# Patient Record
Sex: Female | Born: 1935
Health system: Southern US, Community
[De-identification: ages and names within clinical notes are randomized; demographics above are authoritative.]

## PROBLEM LIST (undated history)

## (undated) DIAGNOSIS — H9190 Unspecified hearing loss, unspecified ear: Secondary | ICD-10-CM

## (undated) DIAGNOSIS — I10 Essential (primary) hypertension: Secondary | ICD-10-CM

## (undated) DIAGNOSIS — M1990 Unspecified osteoarthritis, unspecified site: Secondary | ICD-10-CM

## (undated) HISTORY — PX: WRIST FRACTURE SURGERY: SHX121

## (undated) HISTORY — PX: COLONOSCOPY: SHX174

## (undated) HISTORY — DX: Essential (primary) hypertension: I10

## (undated) HISTORY — PX: JOINT REPLACEMENT: SHX530

## (undated) HISTORY — PX: HIP SURGERY: SHX245

## (undated) HISTORY — PX: TONSILLECTOMY: SUR1361

## (undated) HISTORY — PX: UPPER GASTROINTESTINAL ENDOSCOPY: SHX188

## (undated) HISTORY — PX: APPENDECTOMY: SHX54

---

## 1998-01-30 ENCOUNTER — Ambulatory Visit (HOSPITAL_COMMUNITY): Admission: RE | Admit: 1998-01-30 | Discharge: 1998-01-30 | Payer: Self-pay | Admitting: *Deleted

## 1998-03-06 ENCOUNTER — Ambulatory Visit (HOSPITAL_COMMUNITY): Admission: RE | Admit: 1998-03-06 | Discharge: 1998-03-06 | Payer: Self-pay | Admitting: Internal Medicine

## 1999-02-19 ENCOUNTER — Ambulatory Visit (HOSPITAL_COMMUNITY): Admission: RE | Admit: 1999-02-19 | Discharge: 1999-02-19 | Payer: Self-pay | Admitting: Obstetrics & Gynecology

## 1999-03-22 ENCOUNTER — Encounter: Payer: Self-pay | Admitting: Orthopedic Surgery

## 1999-03-30 ENCOUNTER — Inpatient Hospital Stay (HOSPITAL_COMMUNITY): Admission: RE | Admit: 1999-03-30 | Discharge: 1999-04-01 | Payer: Self-pay | Admitting: Orthopedic Surgery

## 1999-03-30 ENCOUNTER — Encounter: Payer: Self-pay | Admitting: Orthopedic Surgery

## 1999-04-01 ENCOUNTER — Ambulatory Visit (HOSPITAL_COMMUNITY)
Admission: RE | Admit: 1999-04-01 | Discharge: 1999-04-01 | Payer: Self-pay | Admitting: Physical Medicine & Rehabilitation

## 1999-04-01 ENCOUNTER — Inpatient Hospital Stay
Admission: RE | Admit: 1999-04-01 | Discharge: 1999-04-10 | Payer: Self-pay | Admitting: Physical Medicine & Rehabilitation

## 1999-07-26 ENCOUNTER — Ambulatory Visit (HOSPITAL_COMMUNITY): Admission: RE | Admit: 1999-07-26 | Discharge: 1999-07-26 | Payer: Self-pay | Admitting: Obstetrics & Gynecology

## 2000-02-04 ENCOUNTER — Ambulatory Visit (HOSPITAL_COMMUNITY): Admission: RE | Admit: 2000-02-04 | Discharge: 2000-02-04 | Payer: Self-pay | Admitting: *Deleted

## 2000-09-19 ENCOUNTER — Ambulatory Visit (HOSPITAL_COMMUNITY): Admission: RE | Admit: 2000-09-19 | Discharge: 2000-09-19 | Payer: Self-pay

## 2001-02-16 ENCOUNTER — Ambulatory Visit (HOSPITAL_COMMUNITY): Admission: RE | Admit: 2001-02-16 | Discharge: 2001-02-16 | Payer: Self-pay | Admitting: Obstetrics & Gynecology

## 2001-05-21 ENCOUNTER — Encounter: Payer: Self-pay | Admitting: Internal Medicine

## 2001-05-21 ENCOUNTER — Ambulatory Visit (HOSPITAL_COMMUNITY): Admission: RE | Admit: 2001-05-21 | Discharge: 2001-05-21 | Payer: Self-pay | Admitting: Internal Medicine

## 2001-07-10 ENCOUNTER — Encounter (HOSPITAL_COMMUNITY): Admission: RE | Admit: 2001-07-10 | Discharge: 2001-08-09 | Payer: Self-pay | Admitting: Specialist

## 2002-02-22 ENCOUNTER — Ambulatory Visit (HOSPITAL_COMMUNITY): Admission: RE | Admit: 2002-02-22 | Discharge: 2002-02-22 | Payer: Self-pay | Admitting: *Deleted

## 2002-05-06 ENCOUNTER — Ambulatory Visit (HOSPITAL_COMMUNITY): Admission: RE | Admit: 2002-05-06 | Discharge: 2002-05-06 | Payer: Self-pay

## 2003-03-19 ENCOUNTER — Ambulatory Visit (HOSPITAL_COMMUNITY): Admission: RE | Admit: 2003-03-19 | Discharge: 2003-03-19 | Payer: Self-pay | Admitting: Internal Medicine

## 2004-11-01 ENCOUNTER — Ambulatory Visit (HOSPITAL_COMMUNITY): Admission: RE | Admit: 2004-11-01 | Discharge: 2004-11-01 | Payer: Self-pay | Admitting: Internal Medicine

## 2005-11-07 ENCOUNTER — Ambulatory Visit (HOSPITAL_COMMUNITY): Admission: RE | Admit: 2005-11-07 | Discharge: 2005-11-07 | Payer: Self-pay | Admitting: Internal Medicine

## 2006-11-09 ENCOUNTER — Ambulatory Visit (HOSPITAL_COMMUNITY): Admission: RE | Admit: 2006-11-09 | Discharge: 2006-11-09 | Payer: Self-pay | Admitting: Internal Medicine

## 2007-05-25 ENCOUNTER — Ambulatory Visit (HOSPITAL_COMMUNITY): Admission: RE | Admit: 2007-05-25 | Discharge: 2007-05-25 | Payer: Self-pay | Admitting: Internal Medicine

## 2007-06-14 ENCOUNTER — Encounter (HOSPITAL_COMMUNITY): Admission: RE | Admit: 2007-06-14 | Discharge: 2007-07-14 | Payer: Self-pay | Admitting: Orthopedic Surgery

## 2007-11-22 ENCOUNTER — Ambulatory Visit (HOSPITAL_COMMUNITY): Admission: RE | Admit: 2007-11-22 | Discharge: 2007-11-22 | Payer: Self-pay | Admitting: Internal Medicine

## 2008-12-10 ENCOUNTER — Ambulatory Visit (HOSPITAL_COMMUNITY): Admission: RE | Admit: 2008-12-10 | Discharge: 2008-12-10 | Payer: Self-pay | Admitting: Internal Medicine

## 2010-01-25 ENCOUNTER — Ambulatory Visit (HOSPITAL_COMMUNITY)
Admission: RE | Admit: 2010-01-25 | Discharge: 2010-01-25 | Payer: Self-pay | Source: Home / Self Care | Attending: Internal Medicine | Admitting: Internal Medicine

## 2010-06-04 NOTE — Consult Note (Signed)
NAME:  Kristen, Marshall NO.:  [id number]   MEDICAL RECORD NO.:  [id number]                  PATIENT TYPE:   LOCATION:                                       FACILITY:   PHYSICIAN:  Kristen Marshall, M.D.                 DATE OF BIRTH:   DATE OF CONSULTATION:  02/27/2003  DATE OF DISCHARGE:                                   CONSULTATION   REQUESTING PHYSICIAN:  Kristen Marshall, M.D.   CHIEF COMPLAINT:  Hematochezia.   HISTORY OF PRESENT ILLNESS:  Kristen Marshall is a 77 year old Caucasian  female who presents with a five-day history of bright red rectal bleeding  with bowel movements.  She describes this as a moderate amount on toilet and  in the stool.  She does have history of adenomatous cecal polyp which was  found on April 20, 1994 colonoscopy.  This was followed by repeat colonoscopy  in [place] 2001 which was normal.  She does have positive family history  with father diagnosed with metastatic colon carcinoma at age 61.  She denies  any associated abdominal pain; however, she is complaining of some right hip  soreness.  She denies any history of hemorrhoids, rectal pain, or pruritus.  She has been having daily bowel movements without any constipation or  diarrhea.  She denies any nausea or vomiting.  She also has history of  occasional dysphagia which has been becoming progressively worse.  She has a  history of a large sliding hiatal hernia and distal esophageal ring that was  last dilated by Dr. Karilyn Marshall in 2001.   PAST SURGICAL HISTORY:  1. Appendectomy.  2. Tonsillectomy.  3. Shoulder surgery.  4. Left hip replacement.  5. Left carpal tunnel release.   CURRENT MEDICATIONS:  1. Hydrochlorothiazide 25 mg daily.  2. Captopril 25 mg daily.  3. Calcium daily.   ALLERGIES:  CODEINE.   FAMILY HISTORY:  Positive for father with colorectal carcinoma as described  in the HPI.  Mother is deceased at age 20 secondary to hypertension.  She  has one  brother in good health.   SOCIAL HISTORY:  Kristen Marshall is a widow and lives alone.  She has four  grown, healthy children.  She is a retired Museum/gallery exhibitions officer.  She  denies any tobacco, alcohol, or drug use.   REVIEW OF SYSTEMS:  CONSTITUTIONAL:  Weight is steadily increasing.  Denies  any fatigue, denies any fever or chills.  CARDIOVASCULAR:  Denies any chest  pain or palpitations.  PULMONARY:  Denies any shortness of breath, dyspnea,  or hemoptysis.  GI:  See HPI.   PHYSICAL EXAMINATION:  VITAL SIGNS:  Weight 146.5 pounds, height 63.25  inches.  Temperature 97.3, blood pressure 130/80, pulse 70.  GENERAL:  Kristen Marshall is a 77 year old Caucasian female who is well-  developed, well-nourished, in no apparent distress today.  She is  alert,  oriented, pleasant, and cooperative.  HEENT:  Sclerae clear, anicteric.  Conjunctivae pink.  Oropharynx pink and  moist without any lesions.  NECK:  Supple without mass or thyromegaly.  CHEST:  Heart regular rate and rhythm with normal S1, S2, without any  murmurs, clicks, rubs, or gallops.  LUNGS: Clear to auscultation bilaterally.  ABDOMEN:  Positive bowel sounds x4, soft, nontender, nondistended, with no  palpable mass or hepatosplenomegaly.  EXTREMITIES:  Show 2+ pedal pulses bilaterally with no edema.  SKIN:  Pink, warm, and dry without any rashes or jaundice.  RECTAL:  No visible external hemorrhoids, good sphincter tone.  The vault  was clear; however, small amount of clear secretions were hemoccult  negative; however, specimen was poor.   ASSESSMENT:  1. Kristen Marshall is a 77 year old Caucasian female with 5 days of intermittent     hematochezia and history of adenomatous colon polyps with positive family     history for colorectal carcinoma.  I have discussed this with Dr. Karilyn Marshall     and would recommended further evaluation with colonoscopy.  2. Dysphagia, probably secondary to esophageal ring.  Will do an EGD at the     same time  with possible esophageal dilatation of ring.   RECOMMENDATIONS:  1. I have discussed both EGD and colonoscopy with Kristen Marshall.  Discussion     includes risks and benefits which include but are not limited to     bleeding, perforation, infection, infection, and drug reaction.  She     agrees with this plan.  Consent will be obtained.  2. Further recommendations pending procedures.   We would like to thank Dr. Ouida Marshall for allowing [place] to participate in the care  of Kristen Marshall.     ________________________________________  ___________________________________________  Kristen Marshall, N.P.                  Kristen Marshall, M.D.   KC/MEDQ  D:  02/27/2003  T:  02/27/2003  Job:  [id number]   cc:   Kristen Marshall, M.D.  [address]  [place]  [place] [address]  Fax: [phone]

## 2010-06-04 NOTE — Discharge Summary (Signed)
[place]  Patient:    KIARA, MCDOWELL                        MRN: [id number] Adm. Date:  [id number] Disc. Date: [id number] Attending:  Faith Rogue T Dictator:   Dian Situ, PA CC:         Georges Lynch. Darrelyn Hillock, M.D.                           Discharge Summary  DISCHARGE DIAGNOSES: 1. Left total hip replacement. 2. Hypertension. 3. Postop anemia. 4. Mild renal insufficiency. 5. Citrobacter UTI.  HISTORY OF PRESENT ILLNESS:  Ms. Lunabelle Oatley is a 77 year old female with history of hypertension and end-stage DJD left hip who elected to undergo left total hip replacement on 3/13 by Dr. Darrelyn Hillock.  Postop discharged on weightbearing status with knee immobilizer used to help keep her left foot everted.  Coumadin is being used for DVT prophylaxis.  INR 2.0.  The patient has had some issues with postop constipation.  Foley was discontinued the same.  CURRENT ISSUES:  Assist for bed mobility and transfers, min-assist ambulating 8 feet with standard walker.  PAST MEDICAL HISTORY:  OA, hypertension, appendectomy, tonsillectomy, rotator cuff repair, and benign breast biopsy.  ALLERGIES:  CODEINE and MORPHINE.  SOCIAL HISTORY:  Patient lives alone and was independent prior to admission. She lives in one-level home with one step at entry.  She does not use any tobacco or alcohol.  HOSPITAL COURSE:  Ms. Najiyah Paris was admitted to rehab on 04/01/99 for inpatient therapies to consist of PT/OT daily.  Past admission patient was maintained on Coumadin for DVT prophylaxis.  As patient made complaints of urinary retention, PVRs were checked and patient was noted to empty without too much difficulty.  Urine C&S checked at admission showed no growth; however, patient was noted to have increase in symptomatology with low-grade fever and a repeat urine C&S on 3/18 grew out greater than 100,000 colonies of Citrobacter.  She was started on Macrodantin  for this and is to continue on this past discharge.  Labs were checked on admission and repeated as patient was noted to have an episode of heme-positive stools.  Her H&H was noted to be improving.  Last check of CBC on 3/21 shows hemoglobin 10.1, hematocrit 29.1, white count 3.7, platelets 430.  Electrolytes from 3/23 showed sodium 135, potassium 3.5, chloride 100, CO2 of 28, BUN 25, creatinine 0.8, glucose 97.  Patient was instructed to push p.o. fluids as she reports she is limiting p.o. fluid intake to help avoid frequency.  Patients staples remained intact and she is to follow up with Dr. Darrelyn Hillock next week with staple removal.  PT/INR at time of discharge is at 20.4 and 2.4 and patient is discharged on 3 mg a day of Coumadin.  Patient to continue on Coumadin for four additional weeks to complete her DVT prophylaxis.  Next protime is to be drawn on Tuesday with results to Dr. ______.  Dr. ______ has graciously agreed to follow patients INR and adjust Coumadin as needed.  Patients left hip incision has healed well without any signs or symptoms of infection.  No drainage or erythema noted.  Follow-up therapies in terms of home PT and OT have been set up through [facility].  On 04/10/99, patient is discharged to home.  DISCHARGE MEDICATIONS:  1. Coumadin 3 mg p.o. per  day.  2. ______ one p.o. b.i.d.  3. Macrodantin 100 mg b.i.d.  4. Coumadin 3 mg q.p.m.  5. Vioxx 12.5 mg per day.  6. Oretic 50 mg per day.  7. Pepcid 20 mg b.i.d.  8. Capoten 25 mg q.12h.  9. Miacalcin nasal spray one puff q.d. 10. Os-Cal 500+D two p.o. b.i.d. 11. Tylenol 650 mg p.o. q.4-6h. p.r.n. pain.  ACTIVITY:  Maintain touchdown weightbearing status on left lower extremity and to use walker.  DIET:  Regular.  SPECIAL INSTRUCTIONS:  No alcohol, no smoking, no driving.  No aspirin, aspirin products, or NSAIDs while on Coumadin.  Maintain total hip precautions.  FOLLOWUP:  Patient is to follow  up with Dr. Darrelyn Hillock next week for postop check to include staple removal.  Follow up with Dr. ______ per schedule.  Follow up with Dr. Riley Kill as needed. DD:  04/10/99 TD:  04/11/99 Job: [id number]

## 2010-06-04 NOTE — Op Note (Signed)
NAME:  Kristen Marshall, Kristen Marshall                           ACCOUNT NO.:  [id number]   MEDICAL RECORD NO.:  [id number]                   PATIENT TYPE:  AMB   LOCATION:  DAY                                  FACILITY:  [facility]   PHYSICIAN:  Lionel December, M.D.                 DATE OF BIRTH:  [birth year]   DATE OF PROCEDURE:  03/19/2003  DATE OF DISCHARGE:                                 OPERATIVE REPORT   PROCEDURE:  Esophagogastroduodenoscopy with esophageal dilatation followed  by total colonoscopy.   INDICATIONS FOR PROCEDURE:  Kristen Marshall is a 77 year old Caucasian female  with known hiatal hernia who was having intermittent heartburn and  regurgitation and now presents with solid food dysphagia.  She is presently  on no acid suppression. She is also undergoing diagnostic and surveillance  colonoscopy.  She had a cecal adenoma removed in 1996 but last exam about  four years ago was normal. She is now having intermittent hematochezia.  Family history is positive for colon carcinoma in her father. The procedure  is reviewed with the patient and informed consent was obtained.   PREOP MEDICATIONS:  Cetacaine spray for pharyngeal topical anesthesia,  Demerol 25 mg IV, Versed 4 mg IV.   FINDINGS:  Procedure performed in endoscopy suite. The patient's vital signs  and O2 sat were monitored during the procedure and remained stable.   #1.  ESOPHAGOGASTRODUODENOSCOPY.  The patient was placed in the left lateral  decubitus position and Olympus videoscope was passed through oropharynx  without any difficulty into the esophagus.   ESOPHAGUS:  The mucosa of the esophagus normal.  She had a ring at the GE  junction 30 cm from the incisors.  The diaphragmatic hiatus was at 37 cm.  She had a large hernia with a dependent segment portion on the left side  marked by mucosal ridge.  Pictures taken for the record.   STOMACH:  It was empty and distended very well with insufflation. Folds of  the proximal stomach  were normal. Examination of the mucosa, body, antrum,  pyloric channel as well as angularis, fundus and cardia was normal.  This  hernia was easily seen on retroflexed view.   DUODENUM:  Examination of the bulb, second and third part of the duodenum  was normal. The endoscope was withdrawn. Esophageal dilatation was attempted  by passing a 56 French Maloney dilator but this could not be advanced to  full insertion.  I felt I was entering the dependent segment of the hernia  on the left side. Therefore it was withdrawn.  The endoscope was passed  again and this ring was dilated/disrupted with the balloon initially to 18  and then to 20 mm. The endoscope was withdrawn.  Pictures were taken for the  record. The patient was prepared for procedure #2.   #2.  COLONOSCOPY:  Rectal examination performed. No abnormality noted on  external or  digital exam. The Olympus videoscope was placed in the rectum  and advanced under direct vision into the sigmoid colon and beyond.  Redundant colon with excellent prep.  The scope was passed through the cecum  which was identified by the appendiceal stump  and the ileocecal valve.  Pictures taken for the record.  There was a small polyp on a fold to the  left of the appendiceal stump. This was ablated via cold biopsy.  As the  scope was withdrawn, the colonic mucosa was once again carefully examined  and there were no other abnormalities.  The rectal mucosa was normal.  The  scope was retroflexed to examine anorectal junction and small hemorrhoids  were noted below the dentate line. The endoscope was straightened and  withdrawn. The patient tolerated the procedure well.   FINAL DIAGNOSIS:  1. Distal esophageal ring was dilated and disrupted using 18-20 mm balloon.  2. Large sliding hiatal hernia.  3. Small polyp ablated via cold biopsy from the cecum.  4. Small external hemorrhoids felt to be the source of her hematochezia.   RECOMMENDATIONS:  1. She will  continue antireflux measures as before.  2. Omeprazole or Prilosec OTC 20 mg p.o. q.a.m.  3. She will continue high fiber diet and Metamucil as before.  4. Colace 1-2 tablets at bedtime.   I will be contacting the patient with biopsy results and further  recommendations.      ___________________________________________                                            Lionel December, M.D.   NR/MEDQ  D:  03/19/2003  T:  03/19/2003  Job:  [id number]   cc:   Kingsley Callander. Ouida Sills, M.D.  [address]  [place]  [place] [address]  Fax: [phone]

## 2010-08-26 ENCOUNTER — Other Ambulatory Visit: Payer: Self-pay

## 2010-08-26 ENCOUNTER — Ambulatory Visit (INDEPENDENT_AMBULATORY_CARE_PROVIDER_SITE_OTHER): Payer: Medicare Other | Admitting: Orthopedic Surgery

## 2010-08-26 ENCOUNTER — Encounter: Payer: Self-pay | Admitting: Orthopedic Surgery

## 2010-08-26 VITALS — Resp 16 | Ht 63.5 in | Wt 148.0 lb

## 2010-08-26 DIAGNOSIS — M23329 Other meniscus derangements, posterior horn of medial meniscus, unspecified knee: Secondary | ICD-10-CM

## 2010-08-26 NOTE — Patient Instructions (Signed)
Apply ice as needed until swelling goes down  Wear brace   Continue ibuprofen or aleve   Return in 6 weeks for re-evaluation

## 2010-08-26 NOTE — Progress Notes (Signed)
Chief complaint: RIGHT knee pain HPI:(4) The patient was walking up some steps the knee gave out it popped locked and she's had sharp medial knee pain since that time although after some Aleve and Advil her pain is decreased significantly.  She still has swelling as 6/10 constant pain but she does not have any mechanical symptoms.  The knee feels a little tight and swollen.  Date of injury was 2 weeks ago  ROS:(2) No chest pain no shortness of breath no frequency no numbness.  He  PFSH: (1) She's had 2 hip replacements, on the RIGHT side she had a mini incision hip [place] in [place] and on the LEFT side she had a regular hip replacement in [facility]  Physical Exam(12) GENERAL: normal development   CDV: pulses are normal   Skin: normal  Lymph: nodes were not palpable/normal  Psychiatric: awake, alert and oriented  Neuro: normal sensation  MSK She is ambulating without any assistive device or a limp 1RIGHT knee.  Inspection she has medial joint line tenderness she has knee swelling.  Her range of motion is normal at 220.  Strength is normal knee is stable.  McMurray's sign is negative.  Assessment: X-rays were taken there is some mild arthritis some osteopenia but no acute fracture there is an abnormality at the front of the knee which appears to be a spur not seen on the lateral view.  Impression probable medial meniscal tear vs. Osteoarthritis/contusion    Plan: Recommend knee brace, ice, anti-inflammatories reevaluate in 6 weeks. A separate identifiable x-ray report 3 views RIGHT knee, x-ray for knee pain rule out fracture  Mild osteopenia is no acute fracture mild osteoarthritis  Impression mild osteoarthritis.

## 2010-10-07 ENCOUNTER — Encounter: Payer: Self-pay | Admitting: Orthopedic Surgery

## 2010-10-07 ENCOUNTER — Ambulatory Visit (INDEPENDENT_AMBULATORY_CARE_PROVIDER_SITE_OTHER): Payer: Medicare Other | Admitting: Orthopedic Surgery

## 2010-10-07 VITALS — Ht 63.0 in | Wt 148.0 lb

## 2010-10-07 DIAGNOSIS — M25569 Pain in unspecified knee: Secondary | ICD-10-CM

## 2010-10-07 NOTE — Patient Instructions (Signed)
Resume normal activity

## 2010-10-07 NOTE — Progress Notes (Signed)
Impression probable medial meniscal tear vs. Osteoarthritis/contusion  F/U   The patient's knee is no longer symptomatic.  Full  range of motion. No tenderness. No swelling.  Follow up as needed

## 2011-01-15 ENCOUNTER — Other Ambulatory Visit (HOSPITAL_COMMUNITY): Payer: Self-pay | Admitting: Internal Medicine

## 2011-01-15 DIAGNOSIS — Z0000 Encounter for general adult medical examination without abnormal findings: Secondary | ICD-10-CM

## 2011-01-24 DIAGNOSIS — H9319 Tinnitus, unspecified ear: Secondary | ICD-10-CM | POA: Diagnosis not present

## 2011-01-31 ENCOUNTER — Ambulatory Visit (HOSPITAL_COMMUNITY)
Admission: RE | Admit: 2011-01-31 | Discharge: 2011-01-31 | Disposition: A | Discharge status: Home / Self Care | Payer: Medicare Other | Source: Ambulatory Visit | Attending: Internal Medicine | Admitting: Internal Medicine

## 2011-01-31 DIAGNOSIS — Z0000 Encounter for general adult medical examination without abnormal findings: Secondary | ICD-10-CM

## 2011-01-31 DIAGNOSIS — Z1231 Encounter for screening mammogram for malignant neoplasm of breast: Secondary | ICD-10-CM | POA: Diagnosis not present

## 2011-02-22 ENCOUNTER — Encounter (INDEPENDENT_AMBULATORY_CARE_PROVIDER_SITE_OTHER): Payer: Self-pay | Admitting: *Deleted

## 2011-02-22 ENCOUNTER — Encounter (INDEPENDENT_AMBULATORY_CARE_PROVIDER_SITE_OTHER): Payer: Self-pay | Admitting: Internal Medicine

## 2011-02-22 ENCOUNTER — Ambulatory Visit (INDEPENDENT_AMBULATORY_CARE_PROVIDER_SITE_OTHER): Payer: Medicare Other | Admitting: Internal Medicine

## 2011-02-22 ENCOUNTER — Telehealth (INDEPENDENT_AMBULATORY_CARE_PROVIDER_SITE_OTHER): Payer: Self-pay | Admitting: *Deleted

## 2011-02-22 ENCOUNTER — Other Ambulatory Visit (INDEPENDENT_AMBULATORY_CARE_PROVIDER_SITE_OTHER): Payer: Self-pay | Admitting: *Deleted

## 2011-02-22 VITALS — BP 120/82 | HR 76 | Temp 98.3°F | Resp 14 | Ht 63.0 in | Wt 148.0 lb

## 2011-02-22 DIAGNOSIS — M1711 Unilateral primary osteoarthritis, right knee: Secondary | ICD-10-CM | POA: Insufficient documentation

## 2011-02-22 DIAGNOSIS — K921 Melena: Secondary | ICD-10-CM

## 2011-02-22 DIAGNOSIS — I10 Essential (primary) hypertension: Secondary | ICD-10-CM | POA: Insufficient documentation

## 2011-02-22 DIAGNOSIS — Z8489 Family history of other specified conditions: Secondary | ICD-10-CM

## 2011-02-22 DIAGNOSIS — Z800 Family history of malignant neoplasm of digestive organs: Secondary | ICD-10-CM

## 2011-02-22 NOTE — H&P (Signed)
Dictated; Job [id number]

## 2011-02-22 NOTE — H&P (Signed)
NAMELEELA, Marshall NO.:  [id number]  MEDICAL RECORD NO.:  [id number]  LOCATION:                                 FACILITY:  PHYSICIAN:  Lionel December, M.D.    DATE OF BIRTH:  1925-03-15  DATE OF ADMISSION: DATE OF DISCHARGE:  LH                             HISTORY & PHYSICAL   PRIMARY CARE PHYSICIAN:  Kingsley Callander. Ouida Sills, MD  PRESENTING COMPLAINT:  Intermittent hematochezia.  HISTORY OF PRESENT ILLNESS:  Kristen Marshall is an 78 year old Caucasian female who presents with few month history of intermittent hematochezia.  She believes this is coming from hemorrhoids.  Because of family history of colon carcinoma in her father, she just wants to be checked to make sure nothing else is going on.  She generally notices bright red blood unusually small in amount.  No pain is associated and she sees blood with her stools.  She has occasional constipation.  She denies abdominal pain, anorexia, weight loss.  She also denies dysphagia.  She has had her esophagus dilated in the past.  She has good appetite and her weight has been stable.  CURRENT MEDICATIONS: 1. Carboxymethylcellulose sodium eyedrops 1-2 drops in each eye twice     daily. 2. Vitamin D 400 units p.o. daily. 3. HCTZ 25 mg p.o. daily. 4. Ibuprofen 600 mg daily p.r.n. which is not often. 5. Lisinopril 20 mg p.o. daily. 6. MVI one p.o. daily.  PAST MEDICAL HISTORY:  She has hypertension.  She has osteoarthrosis of right knee.  She has been under care of Dr. Roseanne Reno and treated with brace and pool therapy last year, and presently doing well.  She had left hip replacement 11 years ago and right hip was done 4 years ago.  She had surgery pending for left wrist fracture and had decompression of carpal tunnel as well.  She also had surgery on left shoulder for a fracture.  She has had bilateral cataract extraction.  History of hiatal hernia.  She has had her esophagus dilated in 2005, and more recently in August  2008.  The patient's last colonoscopy was in August 2008, revealing few diverticula at sigmoid colon and external hemorrhoids.  ALLERGIES:  To codeine.  FAMILY HISTORY:  Father was diagnosed with colon carcinoma at age 2 and died within 2 months of diagnosis.  By the time, condition was diagnosed, already had widely spread.  Mother died of pneumonia at age 54.  She has a brother, age 65 who has brain tumor and currently getting hospice care at home.  She has a grandson, who was treated for leukemia and developed cirrhosis due to chronic hepatitis C from transfusion and now coping at her liver transplant.  SOCIAL HISTORY:  She is retired.  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  She has 2 daughters and 2 sons and they are all in good health.  She does not smoke cigarettes or drink alcohol.  OBJECTIVE:  VITAL SIGNS:  Weight 148 pounds, she is 63 inches tall, pulse 76 per minute, blood pressure 120/82, respirations 16, and temp is  98.3. HEENT:  Conjunctivae is pink.  Sclera is nonicteric.  Oropharyngeal mucosa is normal.  Dentition in satisfactory condition.  No neck masses or thyromegaly noted. CARDIAC:  With regular rhythm.  Normal S1, S2.  No murmur or gallop noted. LUNGS:  Clear to auscultation. ABDOMEN:  Symmetrical.  She has subcutaneous soft lesion in midepigastric region with erythematous skin, but no punctum noted.  (she has had a sebaceous cyst removed from this area 40 years ago).  Abdomen is otherwise soft and nontender without organomegaly or masses. RECTAL:  Deferred. EXTREMITIES:  No peripheral edema or clubbing noted.  ASSESSMENT:  Kristen Marshall is an 79 year old Caucasian female who is in excellent health who presents with intermittent hematochezia occurring 2-3 times a month.  She does not have any significant problem with constipation, diarrhea, or abdominal pain.  Family  history is significant for colon carcinoma in her father who was diagnosed with at age 71 and died within 2 months of metastatic disease.  Given the patient is in excellent health, it would be reasonable to recommend colonoscopy since she is having intermittent hematochezia, which is most likely secondary to hemorrhoids, but need to make sure she does not have other lesions.  She has history of hiatal hernia.  She has had a esophageal ring dilated twice in the past, but presently she is not having these symptoms, therefore no indication for EGD.  She has what appears to be sebaceous cyst in epigastric region.  She needs to have it removed before it gets infected.  She will make an appointment to see her dermatologist.  RECOMMENDATIONS:  We will schedule her for colonoscopy at [facility] in near future.  I have answered the patient's questions and she is agreeable.                                           ______________________________ Lionel December, M.D.     NR/MEDQ  D:  02/22/2011  T:  02/22/2011  Job:  [id number]  cc:   Kingsley Callander. Ouida Sills, MD Fax: [phone]

## 2011-02-22 NOTE — Patient Instructions (Signed)
Colonoscopy to be scheduled. 

## 2011-02-22 NOTE — Telephone Encounter (Signed)
Patient needs trilyte 

## 2011-02-25 MED ORDER — PEG 3350-KCL-NA BICARB-NACL 420 G PO SOLR: 4000.0000 mL | Freq: Once | ORAL | Status: AC

## 2011-02-28 NOTE — Progress Notes (Signed)
PRIMARY CARE PHYSICIAN: Kingsley Callander. Ouida Sills, MD  PRESENTING COMPLAINT: Intermittent hematochezia.  HISTORY OF PRESENT ILLNESS: Kristen Marshall is an 78 year old Caucasian female who  presents with few month history of intermittent hematochezia. She  believes this is coming from hemorrhoids. Because of family history of  colon carcinoma in her father, she just wants to be checked to make sure  nothing else is going on. She generally notices bright red blood  unusually small in amount. No pain is associated and she sees blood  with her stools. She has occasional constipation. She denies abdominal  pain, anorexia, weight loss. She also denies dysphagia. She has had  her esophagus dilated in the past. She has good appetite and her weight  has been stable.  CURRENT MEDICATIONS:  1. Carboxymethylcellulose sodium eyedrops 1-2 drops in each eye twice  daily.  2. Vitamin D 400 units p.o. daily.  3. HCTZ 25 mg p.o. daily.  4. Ibuprofen 600 mg daily p.r.n. which is not often.  5. Lisinopril 20 mg p.o. daily.  6. MVI one p.o. daily.  PAST MEDICAL HISTORY: She has hypertension.  She has osteoarthrosis of right knee. She has been under care of Dr.  Romeo Apple and treated with brace and pool therapy last year, and presently  doing well.  She had left hip replacement 11 years ago and right hip was done 4 years  ago.  She had surgery pending for left wrist fracture and had decompression of  carpal tunnel as well.  She also had surgery on left shoulder for a fracture.  She has had bilateral cataract extraction.  History of hiatal hernia. She has had her esophagus dilated in 2005,  and more recently in August 2008.  The patient's last colonoscopy was in August 2008, revealing few  diverticula at sigmoid colon and external hemorrhoids.  ALLERGIES: To codeine.  FAMILY HISTORY: Father was diagnosed with colon carcinoma at age 30 and  died within 2 months of diagnosis. By the time, condition was  diagnosed, already had  widely spread. Mother died of pneumonia at age  87. She has a brother, age 36 who has brain tumor and currently getting  hospice care at home. She has a grandson, who was treated for leukemia  and developed cirrhosis due to chronic hepatitis C from transfusion and  now coping at her liver transplant.  SOCIAL HISTORY: She is retired. She worked in Administrator transcription  for 12 years at [employer] and for 13 years at [employer] before retirement. She has 2 daughters and 2 sons and they are  all in good health. She does not smoke cigarettes or drink alcohol.  OBJECTIVE: VITAL SIGNS: Weight 148 pounds, she is 63 inches tall,  pulse 76 per minute, blood pressure 120/82, respirations 16, and temp is  98.3.  HEENT: Conjunctivae is pink. Sclera is nonicteric. Oropharyngeal  mucosa is normal. Dentition in satisfactory condition. No neck masses  or thyromegaly noted.  CARDIAC: With regular rhythm. Normal S1, S2. No murmur or gallop  noted.  LUNGS: Clear to auscultation.  ABDOMEN: Symmetrical. She has subcutaneous soft lesion in  midepigastric region with erythematous skin, but no punctum noted. (she  has had a sebaceous cyst removed from this area 40 years ago). Abdomen  is otherwise soft and nontender without organomegaly or masses.  RECTAL: Deferred.  EXTREMITIES: No peripheral edema or clubbing noted.  ASSESSMENT: Kristen Marshall is an 78 year old Caucasian female who is in excellent  health who presents with intermittent hematochezia occurring  2-3 times a  month. She does not have any significant problem with constipation,  diarrhea, or abdominal pain. Family history is significant for colon  carcinoma in her father who was diagnosed with at age 64 and died within  2 months of metastatic disease. Given the patient is in excellent  health, it would be reasonable to recommend colonoscopy since she is  having intermittent hematochezia, which is most likely secondary to    hemorrhoids, but need to make sure she does not have other lesions.  She has history of hiatal hernia. She has had a esophageal ring dilated  twice in the past, but presently she is not having these symptoms,  therefore no indication for EGD.  She has what appears to be sebaceous cyst in epigastric region. She  needs to have it removed before it gets infected. She will make an  appointment to see her dermatologist.  RECOMMENDATIONS: We will schedule her for colonoscopy at [facility] in near  future. I have answered the patient's questions and she is agreeable

## 2011-03-16 MED ORDER — SODIUM CHLORIDE 0.45 % IV SOLN
Freq: Once | INTRAVENOUS | Status: AC
  Administered 2011-03-17: 13:00:00 via INTRAVENOUS

## 2011-03-17 ENCOUNTER — Encounter (HOSPITAL_COMMUNITY): Payer: Self-pay

## 2011-03-17 ENCOUNTER — Encounter (HOSPITAL_COMMUNITY)
Admission: RE | Disposition: A | Discharge status: Home / Self Care | Payer: Self-pay | Source: Ambulatory Visit | Attending: Internal Medicine

## 2011-03-17 ENCOUNTER — Ambulatory Visit (HOSPITAL_COMMUNITY)
Admission: RE | Admit: 2011-03-17 | Discharge: 2011-03-17 | Disposition: A | Discharge status: Home / Self Care | Payer: Medicare Other | Source: Ambulatory Visit | Attending: Internal Medicine | Admitting: Internal Medicine

## 2011-03-17 DIAGNOSIS — Z800 Family history of malignant neoplasm of digestive organs: Secondary | ICD-10-CM

## 2011-03-17 DIAGNOSIS — K6389 Other specified diseases of intestine: Secondary | ICD-10-CM

## 2011-03-17 DIAGNOSIS — K648 Other hemorrhoids: Secondary | ICD-10-CM | POA: Insufficient documentation

## 2011-03-17 DIAGNOSIS — K5730 Diverticulosis of large intestine without perforation or abscess without bleeding: Secondary | ICD-10-CM

## 2011-03-17 DIAGNOSIS — K5900 Constipation, unspecified: Secondary | ICD-10-CM

## 2011-03-17 DIAGNOSIS — K921 Melena: Secondary | ICD-10-CM

## 2011-03-17 HISTORY — DX: Unspecified osteoarthritis, unspecified site: M19.90

## 2011-03-17 HISTORY — DX: Unspecified hearing loss, unspecified ear: H91.90

## 2011-03-17 HISTORY — PX: COLONOSCOPY: SHX5424

## 2011-03-17 SURGERY — COLONOSCOPY
Anesthesia: Moderate Sedation

## 2011-03-17 MED ORDER — STERILE WATER FOR IRRIGATION IR SOLN
Status: DC | PRN
  Administered 2011-03-17: 14:00:00

## 2011-03-17 MED ORDER — MIDAZOLAM HCL 5 MG/5ML IJ SOLN
INTRAMUSCULAR | Status: DC | PRN
  Administered 2011-03-17: 2 mg via INTRAVENOUS
  Administered 2011-03-17: 1 mg via INTRAVENOUS

## 2011-03-17 MED ORDER — MIDAZOLAM HCL 5 MG/5ML IJ SOLN
INTRAMUSCULAR | Status: AC
  Filled 2011-03-17: qty 10

## 2011-03-17 MED ORDER — MEPERIDINE HCL 50 MG/ML IJ SOLN
INTRAMUSCULAR | Status: DC | PRN
  Administered 2011-03-17: 15 mg via INTRAVENOUS
  Administered 2011-03-17: 10 mg via INTRAVENOUS

## 2011-03-17 MED ORDER — MEPERIDINE HCL 50 MG/ML IJ SOLN
INTRAMUSCULAR | Status: AC
  Filled 2011-03-17: qty 1

## 2011-03-17 NOTE — Op Note (Addendum)
COLONOSCOPY PROCEDURE REPORT  PATIENT:  Kristen Marshall  MR#:  [id number] Birthdate:  1925-05-26, 78 y.o., female Endoscopist:  Dr. Malissa Hippo, MD Referred By:  Dr. Rockey Situ, MD Procedure Date: 03/17/2011  Procedure:   Colonoscopy  Indications:  Patient is is an 78 year old Caucasian female with intermittent hematochezia. Family history is positive for colon carcinoma in her father who died at age 46 within 2 months of diagnosis. She is undergoing diagnostic colonoscopy to  Informed Consent:  Procedure and risks were reviewed with the patient and informed consent was obtained.  Medications:  Demerol 25 mg IV Versed 3 mg IV  Description of procedure:  After a digital rectal exam was performed, that colonoscope was advanced from the anus through the rectum and colon to the area of the cecum, ileocecal valve and appendiceal orifice. The cecum was deeply intubated. These structures were well-seen and photographed for the record. From the level of the cecum and ileocecal valve, the scope was slowly and cautiously withdrawn. The mucosal surfaces were carefully surveyed utilizing scope tip to flexion to facilitate fold flattening as needed. The scope was pulled down into the rectum where a thorough exam including retroflexion was performed.  Findings:   Prep satisfactory. Moderate number of diverticula in sigmoid colon and a few more proximally. About 15 mm submucosal lipoma at transverse colon. Normal rectal mucosa. Small hemorrhoids of the dentate line.  Therapeutic/Diagnostic Maneuvers Performed:  None  Complications:  None  Cecal Withdrawal Time:  10 minutes  Impression:  Examination performed to cecum. Pancolonic diverticulosis. Most of the diverticula of sigmoid colon. Incidental finding of submucosal lipoma at transverse colon Internal hemorrhoids felt to be source of intermittent hematochezia.  Recommendations:  Standard instructions given.  Meryn Sarracino U  03/17/2011  2:17 PM  CC: Dr. Carylon Perches, MD, MD & Dr. Bonnetta Barry ref. provider found

## 2011-03-17 NOTE — Discharge Instructions (Addendum)
Resume medications. High fiber diet. Can take Colace(stool softener) two  tablets daily at bedtime if needed for constipation. No driving for 24 hours.High Fiber Diet A high fiber diet changes your normal diet to include more whole grains, legumes, fruits, and vegetables. Changes in the diet involve replacing refined carbohydrates with unrefined foods. The calorie level of the diet is essentially unchanged. The Dietary Reference Intake (recommended amount) for adult males is 38 g per day. For adult females, it is 25 g per day. Pregnant and lactating women should consume 28 g of fiber per day. Fiber is the intact part of a plant that is not broken down during digestion. Functional fiber is fiber that has been isolated from the plant to provide a beneficial effect in the body. PURPOSE  Increase stool bulk.   Ease and regulate bowel movements.   Lower cholesterol.  INDICATIONS THAT YOU NEED MORE FIBER  Constipation and hemorrhoids.   Uncomplicated diverticulosis (intestine condition) and irritable bowel syndrome.   Weight management.   As a protective measure against hardening of the arteries (atherosclerosis), diabetes, and cancer.  NOTE OF CAUTION If you have a digestive or bowel problem, ask your caregiver for advice before adding high fiber foods to your diet. Some of the following medical problems are such that a high fiber diet should not be used without consulting your caregiver:  Acute diverticulitis (intestine infection).   Partial small bowel obstructions.   Complicated diverticular disease involving bleeding, rupture (perforation), or abscess (boil, furuncle).   Presence of autonomic neuropathy (nerve damage) or gastric paresis (stomach cannot empty itself).  GUIDELINES FOR INCREASING FIBER  Start adding fiber to the diet slowly. A gradual increase of about 5 more grams (2 slices of whole-wheat bread, 2 servings of most fruits or vegetables, or 1 bowl of high fiber cereal)  per day is best. Too rapid an increase in fiber may result in constipation, flatulence, and bloating.   Drink enough water and fluids to keep your urine clear or pale yellow. Water, juice, or caffeine-free drinks are recommended. Not drinking enough fluid may cause constipation.   Eat a variety of high fiber foods rather than one type of fiber.   Try to increase your intake of fiber through using high fiber foods rather than fiber pills or supplements that contain small amounts of fiber.   The goal is to change the types of food eaten. Do not supplement your present diet with high fiber foods, but replace foods in your present diet.  INCLUDE A VARIETY OF FIBER SOURCES  Replace refined and processed grains with whole grains, canned fruits with fresh fruits, and incorporate other fiber sources. White rice, white breads, and most bakery goods contain little or no fiber.   Brown whole-grain rice, buckwheat oats, and many fruits and vegetables are all good sources of fiber. These include: broccoli, Brussels sprouts, cabbage, cauliflower, beets, sweet potatoes, white potatoes (skin on), carrots, tomatoes, eggplant, squash, berries, fresh fruits, and dried fruits.   Cereals appear to be the richest source of fiber. Cereal fiber is found in whole grains and bran. Bran is the fiber-rich outer coat of cereal grain, which is largely removed in refining. In whole-grain cereals, the bran remains. In breakfast cereals, the largest amount of fiber is found in those with "bran" in their names. The fiber content is sometimes indicated on the label.   You may need to include additional fruits and vegetables each day.   In baking, for 1 cup white flour,  you may use the following substitutions:   1 cup whole-wheat flour minus 2 tbs.    cup white flour plus  cup whole-wheat flour.  Document Released: 01/03/2005 Document Revised: 09/15/2010 Document Reviewed: 11/11/2008 [facility] Patient Information 2012  [place], [place].Hemorrhoids Hemorrhoids are enlarged (dilated) veins around the rectum. There are 2 types of hemorrhoids, and the type of hemorrhoid is determined by its location. Internal hemorrhoids occur in the veins just inside the rectum.They are usually not painful, but they may bleed.However, they may poke through to the outside and become irritated and painful. External hemorrhoids involve the veins outside the anus and can be felt as a painful swelling or hard lump near the anus.They are often itchy and may crack and bleed. Sometimes clots will form in the veins. This makes them swollen and painful. These are called thrombosed hemorrhoids. CAUSES Causes of hemorrhoids include:  Pregnancy. This increases the pressure in the hemorrhoidal veins.   Constipation.   Straining to have a bowel movement.   Obesity.   Heavy lifting or other activity that caused you to strain.  TREATMENT Most of the time hemorrhoids improve in 1 to 2 weeks. However, if symptoms do not seem to be getting better or if you have a lot of rectal bleeding, your caregiver may perform a procedure to help make the hemorrhoids get smaller or remove them completely.Possible treatments include:  Rubber band ligation. A rubber band is placed at the base of the hemorrhoid to cut off the circulation.   Sclerotherapy. A chemical is injected to shrink the hemorrhoid.   Infrared light therapy. Tools are used to burn the hemorrhoid.   Hemorrhoidectomy. This is surgical removal of the hemorrhoid.  HOME CARE INSTRUCTIONS   Increase fiber in your diet. Ask your caregiver about using fiber supplements.   Drink enough water and fluids to keep your urine clear or pale yellow.   Exercise regularly.   Go to the bathroom when you have the urge to have a bowel movement. Do not wait.   Avoid straining to have bowel movements.   Keep the anal area dry and clean.   Only take over-the-counter or prescription medicines for  pain, discomfort, or fever as directed by your caregiver.  If your hemorrhoids are thrombosed:  Take warm sitz baths for 20 to 30 minutes, 3 to 4 times per day.   If the hemorrhoids are very tender and swollen, place ice packs on the area as tolerated. Using ice packs between sitz baths may be helpful. Fill a plastic bag with ice. Place a towel between the bag of ice and your skin.   Medicated creams and suppositories may be used or applied as directed.   Do not use a donut-shaped pillow or sit on the toilet for long periods. This increases blood pooling and pain.  SEEK MEDICAL CARE IF:   You have increasing pain and swelling that is not controlled with your medicine.   You have uncontrolled bleeding.   You have difficulty or you are unable to have a bowel movement.   You have pain or inflammation outside the area of the hemorrhoids.   You have chills or an oral temperature above 102 F (38.9 C).  MAKE SURE YOU:   Understand these instructions.   Will watch your condition.   Will get help right away if you are not doing well or get worse.  Document Released: 01/01/2000 Document Revised: 09/15/2010 Document Reviewed: 05/08/2007 [facility] Patient Information 2012 [place], [place].Diverticulosis Diverticulosis is a  common condition that develops when small pouches (diverticula) form in the wall of the colon. The risk of diverticulosis increases with age. It happens more often in people who eat a low-fiber diet. Most individuals with diverticulosis have no symptoms. Those individuals with symptoms usually experience abdominal pain, constipation, or loose stools (diarrhea). HOME CARE INSTRUCTIONS   Increase the amount of fiber in your diet as directed by your caregiver or dietician. This may reduce symptoms of diverticulosis.   Your caregiver may recommend taking a dietary fiber supplement.   Drink at least 6 to 8 glasses of water each day to prevent constipation.   Try not to strain  when you have a bowel movement.   Your caregiver may recommend avoiding nuts and seeds to prevent complications, although this is still an uncertain benefit.   Only take over-the-counter or prescription medicines for pain, discomfort, or fever as directed by your caregiver.  FOODS WITH HIGH FIBER CONTENT INCLUDE:  Fruits. Apple, peach, pear, tangerine, raisins, prunes.   Vegetables. Brussels sprouts, asparagus, broccoli, cabbage, carrot, cauliflower, romaine lettuce, spinach, summer squash, tomato, winter squash, zucchini.   Starchy Vegetables. Baked beans, kidney beans, lima beans, split peas, lentils, potatoes (with skin).   Grains. Whole wheat bread, brown rice, bran flake cereal, plain oatmeal, white rice, shredded wheat, bran muffins.  SEEK IMMEDIATE MEDICAL CARE IF:   You develop increasing pain or severe bloating.   You have an oral temperature above 102 F (38.9 C), not controlled by medicine.   You develop vomiting or bowel movements that are bloody or black.  Document Released: 10/01/2003 Document Revised: 09/15/2010 Document Reviewed: 06/03/2009 [facility] Patient Information 2012 [place], [place].

## 2011-03-17 NOTE — H&P (Signed)
This is an update to history and physical from [facility] 06/06/2011. There's been no interval change in patient's history of medications. She is undergoing diagnostic colonoscopy.

## 2011-03-22 ENCOUNTER — Encounter (HOSPITAL_COMMUNITY): Payer: Self-pay | Admitting: Internal Medicine

## 2011-06-22 ENCOUNTER — Encounter (INDEPENDENT_AMBULATORY_CARE_PROVIDER_SITE_OTHER): Payer: Self-pay

## 2011-07-07 DIAGNOSIS — R609 Edema, unspecified: Secondary | ICD-10-CM | POA: Diagnosis not present

## 2011-07-07 DIAGNOSIS — I10 Essential (primary) hypertension: Secondary | ICD-10-CM | POA: Diagnosis not present

## 2011-11-15 DIAGNOSIS — Z23 Encounter for immunization: Secondary | ICD-10-CM | POA: Diagnosis not present

## 2012-01-23 DIAGNOSIS — M899 Disorder of bone, unspecified: Secondary | ICD-10-CM | POA: Diagnosis not present

## 2012-01-23 DIAGNOSIS — Z79899 Other long term (current) drug therapy: Secondary | ICD-10-CM | POA: Diagnosis not present

## 2012-01-23 DIAGNOSIS — I10 Essential (primary) hypertension: Secondary | ICD-10-CM | POA: Diagnosis not present

## 2012-01-30 ENCOUNTER — Other Ambulatory Visit (HOSPITAL_COMMUNITY): Payer: Self-pay | Admitting: Internal Medicine

## 2012-01-30 DIAGNOSIS — R319 Hematuria, unspecified: Secondary | ICD-10-CM | POA: Diagnosis not present

## 2012-01-30 DIAGNOSIS — Z139 Encounter for screening, unspecified: Secondary | ICD-10-CM

## 2012-01-30 DIAGNOSIS — I10 Essential (primary) hypertension: Secondary | ICD-10-CM | POA: Diagnosis not present

## 2012-01-30 DIAGNOSIS — M1990 Unspecified osteoarthritis, unspecified site: Secondary | ICD-10-CM | POA: Diagnosis not present

## 2012-01-30 DIAGNOSIS — M810 Age-related osteoporosis without current pathological fracture: Secondary | ICD-10-CM | POA: Diagnosis not present

## 2012-02-06 ENCOUNTER — Ambulatory Visit (HOSPITAL_COMMUNITY)
Admission: RE | Admit: 2012-02-06 | Discharge: 2012-02-06 | Disposition: A | Discharge status: Home / Self Care | Payer: Medicare Other | Source: Ambulatory Visit | Attending: Internal Medicine | Admitting: Internal Medicine

## 2012-02-06 DIAGNOSIS — Z1231 Encounter for screening mammogram for malignant neoplasm of breast: Secondary | ICD-10-CM | POA: Diagnosis not present

## 2012-02-06 DIAGNOSIS — Z139 Encounter for screening, unspecified: Secondary | ICD-10-CM

## 2012-02-13 DIAGNOSIS — R319 Hematuria, unspecified: Secondary | ICD-10-CM | POA: Diagnosis not present

## 2012-07-30 DIAGNOSIS — I10 Essential (primary) hypertension: Secondary | ICD-10-CM | POA: Diagnosis not present

## 2012-08-01 DIAGNOSIS — H26499 Other secondary cataract, unspecified eye: Secondary | ICD-10-CM | POA: Diagnosis not present

## 2012-08-01 DIAGNOSIS — H4011X0 Primary open-angle glaucoma, stage unspecified: Secondary | ICD-10-CM | POA: Diagnosis not present

## 2012-11-15 DIAGNOSIS — Z23 Encounter for immunization: Secondary | ICD-10-CM | POA: Diagnosis not present

## 2013-02-01 DIAGNOSIS — M899 Disorder of bone, unspecified: Secondary | ICD-10-CM | POA: Diagnosis not present

## 2013-02-01 DIAGNOSIS — Z79899 Other long term (current) drug therapy: Secondary | ICD-10-CM | POA: Diagnosis not present

## 2013-02-01 DIAGNOSIS — M949 Disorder of cartilage, unspecified: Secondary | ICD-10-CM | POA: Diagnosis not present

## 2013-02-01 DIAGNOSIS — I10 Essential (primary) hypertension: Secondary | ICD-10-CM | POA: Diagnosis not present

## 2013-02-01 DIAGNOSIS — E569 Vitamin deficiency, unspecified: Secondary | ICD-10-CM | POA: Diagnosis not present

## 2013-02-08 DIAGNOSIS — Z0000 Encounter for general adult medical examination without abnormal findings: Secondary | ICD-10-CM | POA: Diagnosis not present

## 2013-02-08 DIAGNOSIS — R000 Tachycardia, unspecified: Secondary | ICD-10-CM | POA: Diagnosis not present

## 2013-02-27 ENCOUNTER — Other Ambulatory Visit (HOSPITAL_COMMUNITY): Payer: Self-pay | Admitting: Internal Medicine

## 2013-02-27 DIAGNOSIS — Z1231 Encounter for screening mammogram for malignant neoplasm of breast: Secondary | ICD-10-CM

## 2013-03-11 ENCOUNTER — Ambulatory Visit (HOSPITAL_COMMUNITY): Payer: Medicare Other

## 2013-03-14 ENCOUNTER — Ambulatory Visit (HOSPITAL_COMMUNITY): Payer: Medicare Other

## 2013-03-28 ENCOUNTER — Ambulatory Visit (HOSPITAL_COMMUNITY): Payer: Medicare Other

## 2013-03-29 ENCOUNTER — Ambulatory Visit (HOSPITAL_COMMUNITY)
Admission: RE | Admit: 2013-03-29 | Discharge: 2013-03-29 | Disposition: A | Discharge status: Home / Self Care | Payer: Medicare Other | Source: Ambulatory Visit | Attending: Internal Medicine | Admitting: Internal Medicine

## 2013-03-29 DIAGNOSIS — Z1231 Encounter for screening mammogram for malignant neoplasm of breast: Secondary | ICD-10-CM | POA: Insufficient documentation

## 2013-08-09 DIAGNOSIS — C4499 Other specified malignant neoplasm of skin, unspecified: Secondary | ICD-10-CM | POA: Diagnosis not present

## 2013-08-09 DIAGNOSIS — I10 Essential (primary) hypertension: Secondary | ICD-10-CM | POA: Diagnosis not present

## 2013-08-13 ENCOUNTER — Ambulatory Visit (HOSPITAL_COMMUNITY)
Admission: RE | Admit: 2013-08-13 | Discharge: 2013-08-13 | Disposition: A | Discharge status: Home / Self Care | Payer: Medicare Other | Source: Ambulatory Visit | Attending: Internal Medicine | Admitting: Internal Medicine

## 2013-08-13 ENCOUNTER — Other Ambulatory Visit (HOSPITAL_COMMUNITY): Payer: Self-pay | Admitting: Internal Medicine

## 2013-08-13 DIAGNOSIS — M1710 Unilateral primary osteoarthritis, unspecified knee: Secondary | ICD-10-CM | POA: Diagnosis not present

## 2013-08-13 DIAGNOSIS — IMO0002 Reserved for concepts with insufficient information to code with codable children: Secondary | ICD-10-CM | POA: Insufficient documentation

## 2013-08-13 DIAGNOSIS — M25569 Pain in unspecified knee: Secondary | ICD-10-CM | POA: Insufficient documentation

## 2013-08-13 DIAGNOSIS — M25562 Pain in left knee: Secondary | ICD-10-CM

## 2013-08-13 DIAGNOSIS — M1610 Unilateral primary osteoarthritis, unspecified hip: Secondary | ICD-10-CM | POA: Diagnosis not present

## 2013-08-13 DIAGNOSIS — M169 Osteoarthritis of hip, unspecified: Secondary | ICD-10-CM | POA: Diagnosis not present

## 2013-08-15 DIAGNOSIS — C44621 Squamous cell carcinoma of skin of unspecified upper limb, including shoulder: Secondary | ICD-10-CM | POA: Diagnosis not present

## 2013-08-27 ENCOUNTER — Ambulatory Visit: Payer: Medicare Other | Admitting: Orthopedic Surgery

## 2013-09-19 DIAGNOSIS — D235 Other benign neoplasm of skin of trunk: Secondary | ICD-10-CM | POA: Diagnosis not present

## 2013-09-19 DIAGNOSIS — Z85828 Personal history of other malignant neoplasm of skin: Secondary | ICD-10-CM | POA: Diagnosis not present

## 2013-10-09 DIAGNOSIS — H4011X0 Primary open-angle glaucoma, stage unspecified: Secondary | ICD-10-CM | POA: Diagnosis not present

## 2013-10-24 DIAGNOSIS — Z23 Encounter for immunization: Secondary | ICD-10-CM | POA: Diagnosis not present

## 2014-02-14 DIAGNOSIS — M810 Age-related osteoporosis without current pathological fracture: Secondary | ICD-10-CM | POA: Diagnosis not present

## 2014-02-14 DIAGNOSIS — H409 Unspecified glaucoma: Secondary | ICD-10-CM | POA: Diagnosis not present

## 2014-02-14 DIAGNOSIS — Z79899 Other long term (current) drug therapy: Secondary | ICD-10-CM | POA: Diagnosis not present

## 2014-02-14 DIAGNOSIS — I10 Essential (primary) hypertension: Secondary | ICD-10-CM | POA: Diagnosis not present

## 2014-02-14 DIAGNOSIS — E559 Vitamin D deficiency, unspecified: Secondary | ICD-10-CM | POA: Diagnosis not present

## 2014-02-21 DIAGNOSIS — Z0001 Encounter for general adult medical examination with abnormal findings: Secondary | ICD-10-CM | POA: Diagnosis not present

## 2014-04-24 ENCOUNTER — Other Ambulatory Visit (HOSPITAL_COMMUNITY): Payer: Self-pay | Admitting: Internal Medicine

## 2014-04-24 DIAGNOSIS — Z1231 Encounter for screening mammogram for malignant neoplasm of breast: Secondary | ICD-10-CM

## 2014-05-07 ENCOUNTER — Ambulatory Visit (HOSPITAL_COMMUNITY)
Admission: RE | Admit: 2014-05-07 | Discharge: 2014-05-07 | Disposition: A | Discharge status: Home / Self Care | Payer: Medicare Other | Source: Ambulatory Visit | Attending: Internal Medicine | Admitting: Internal Medicine

## 2014-05-07 DIAGNOSIS — Z1231 Encounter for screening mammogram for malignant neoplasm of breast: Secondary | ICD-10-CM | POA: Diagnosis not present

## 2014-08-25 DIAGNOSIS — M1991 Primary osteoarthritis, unspecified site: Secondary | ICD-10-CM | POA: Diagnosis not present

## 2014-08-25 DIAGNOSIS — I10 Essential (primary) hypertension: Secondary | ICD-10-CM | POA: Diagnosis not present

## 2014-08-25 DIAGNOSIS — Z6827 Body mass index (BMI) 27.0-27.9, adult: Secondary | ICD-10-CM | POA: Diagnosis not present

## 2014-10-31 DIAGNOSIS — Z23 Encounter for immunization: Secondary | ICD-10-CM | POA: Diagnosis not present

## 2014-11-03 ENCOUNTER — Encounter (INDEPENDENT_AMBULATORY_CARE_PROVIDER_SITE_OTHER): Payer: Medicare Other | Admitting: Ophthalmology

## 2014-11-03 DIAGNOSIS — I10 Essential (primary) hypertension: Secondary | ICD-10-CM

## 2014-11-03 DIAGNOSIS — H353123 Nonexudative age-related macular degeneration, left eye, advanced atrophic without subfoveal involvement: Secondary | ICD-10-CM

## 2014-11-03 DIAGNOSIS — H35033 Hypertensive retinopathy, bilateral: Secondary | ICD-10-CM

## 2014-11-03 DIAGNOSIS — H43813 Vitreous degeneration, bilateral: Secondary | ICD-10-CM | POA: Diagnosis not present

## 2014-11-27 DIAGNOSIS — H401132 Primary open-angle glaucoma, bilateral, moderate stage: Secondary | ICD-10-CM | POA: Diagnosis not present

## 2014-11-27 DIAGNOSIS — H353132 Nonexudative age-related macular degeneration, bilateral, intermediate dry stage: Secondary | ICD-10-CM | POA: Diagnosis not present

## 2014-11-27 DIAGNOSIS — H04123 Dry eye syndrome of bilateral lacrimal glands: Secondary | ICD-10-CM | POA: Diagnosis not present

## 2015-02-12 DIAGNOSIS — Z961 Presence of intraocular lens: Secondary | ICD-10-CM | POA: Diagnosis not present

## 2015-02-12 DIAGNOSIS — H401132 Primary open-angle glaucoma, bilateral, moderate stage: Secondary | ICD-10-CM | POA: Diagnosis not present

## 2015-02-12 DIAGNOSIS — H26492 Other secondary cataract, left eye: Secondary | ICD-10-CM | POA: Diagnosis not present

## 2015-02-12 DIAGNOSIS — H04123 Dry eye syndrome of bilateral lacrimal glands: Secondary | ICD-10-CM | POA: Diagnosis not present

## 2015-02-16 DIAGNOSIS — I10 Essential (primary) hypertension: Secondary | ICD-10-CM | POA: Diagnosis not present

## 2015-02-16 DIAGNOSIS — M810 Age-related osteoporosis without current pathological fracture: Secondary | ICD-10-CM | POA: Diagnosis not present

## 2015-02-16 DIAGNOSIS — D120 Benign neoplasm of cecum: Secondary | ICD-10-CM | POA: Diagnosis not present

## 2015-02-16 DIAGNOSIS — Z79899 Other long term (current) drug therapy: Secondary | ICD-10-CM | POA: Diagnosis not present

## 2015-02-16 DIAGNOSIS — E559 Vitamin D deficiency, unspecified: Secondary | ICD-10-CM | POA: Diagnosis not present

## 2015-02-24 DIAGNOSIS — Z6826 Body mass index (BMI) 26.0-26.9, adult: Secondary | ICD-10-CM | POA: Diagnosis not present

## 2015-02-24 DIAGNOSIS — I10 Essential (primary) hypertension: Secondary | ICD-10-CM | POA: Diagnosis not present

## 2015-02-24 DIAGNOSIS — N183 Chronic kidney disease, stage 3 (moderate): Secondary | ICD-10-CM | POA: Diagnosis not present

## 2015-02-24 DIAGNOSIS — M1991 Primary osteoarthritis, unspecified site: Secondary | ICD-10-CM | POA: Diagnosis not present

## 2015-02-26 DIAGNOSIS — H401132 Primary open-angle glaucoma, bilateral, moderate stage: Secondary | ICD-10-CM | POA: Diagnosis not present

## 2015-06-26 DIAGNOSIS — H401132 Primary open-angle glaucoma, bilateral, moderate stage: Secondary | ICD-10-CM | POA: Diagnosis not present

## 2015-07-23 ENCOUNTER — Other Ambulatory Visit (HOSPITAL_COMMUNITY): Payer: Self-pay | Admitting: Internal Medicine

## 2015-07-23 DIAGNOSIS — Z1231 Encounter for screening mammogram for malignant neoplasm of breast: Secondary | ICD-10-CM

## 2015-07-29 ENCOUNTER — Ambulatory Visit (HOSPITAL_COMMUNITY)
Admission: RE | Admit: 2015-07-29 | Discharge: 2015-07-29 | Disposition: A | Discharge status: Home / Self Care | Payer: Medicare Other | Source: Ambulatory Visit | Attending: Internal Medicine | Admitting: Internal Medicine

## 2015-07-29 DIAGNOSIS — Z1231 Encounter for screening mammogram for malignant neoplasm of breast: Secondary | ICD-10-CM | POA: Diagnosis not present

## 2015-08-28 DIAGNOSIS — I10 Essential (primary) hypertension: Secondary | ICD-10-CM | POA: Diagnosis not present

## 2015-08-28 DIAGNOSIS — N183 Chronic kidney disease, stage 3 (moderate): Secondary | ICD-10-CM | POA: Diagnosis not present

## 2015-10-22 DIAGNOSIS — Z23 Encounter for immunization: Secondary | ICD-10-CM | POA: Diagnosis not present

## 2015-12-29 DIAGNOSIS — H04123 Dry eye syndrome of bilateral lacrimal glands: Secondary | ICD-10-CM | POA: Diagnosis not present

## 2015-12-29 DIAGNOSIS — H353131 Nonexudative age-related macular degeneration, bilateral, early dry stage: Secondary | ICD-10-CM | POA: Diagnosis not present

## 2015-12-29 DIAGNOSIS — H26492 Other secondary cataract, left eye: Secondary | ICD-10-CM | POA: Diagnosis not present

## 2015-12-29 DIAGNOSIS — H401132 Primary open-angle glaucoma, bilateral, moderate stage: Secondary | ICD-10-CM | POA: Diagnosis not present

## 2015-12-29 DIAGNOSIS — Z961 Presence of intraocular lens: Secondary | ICD-10-CM | POA: Diagnosis not present

## 2016-02-25 DIAGNOSIS — H401132 Primary open-angle glaucoma, bilateral, moderate stage: Secondary | ICD-10-CM | POA: Diagnosis not present

## 2016-03-01 DIAGNOSIS — M1990 Unspecified osteoarthritis, unspecified site: Secondary | ICD-10-CM | POA: Diagnosis not present

## 2016-03-01 DIAGNOSIS — Z79899 Other long term (current) drug therapy: Secondary | ICD-10-CM | POA: Diagnosis not present

## 2016-03-01 DIAGNOSIS — M810 Age-related osteoporosis without current pathological fracture: Secondary | ICD-10-CM | POA: Diagnosis not present

## 2016-03-01 DIAGNOSIS — I10 Essential (primary) hypertension: Secondary | ICD-10-CM | POA: Diagnosis not present

## 2016-03-01 DIAGNOSIS — E559 Vitamin D deficiency, unspecified: Secondary | ICD-10-CM | POA: Diagnosis not present

## 2016-03-01 DIAGNOSIS — N183 Chronic kidney disease, stage 3 (moderate): Secondary | ICD-10-CM | POA: Diagnosis not present

## 2016-03-08 DIAGNOSIS — I10 Essential (primary) hypertension: Secondary | ICD-10-CM | POA: Diagnosis not present

## 2016-03-08 DIAGNOSIS — Z6827 Body mass index (BMI) 27.0-27.9, adult: Secondary | ICD-10-CM | POA: Diagnosis not present

## 2016-03-08 DIAGNOSIS — E559 Vitamin D deficiency, unspecified: Secondary | ICD-10-CM | POA: Diagnosis not present

## 2016-03-08 DIAGNOSIS — N183 Chronic kidney disease, stage 3 (moderate): Secondary | ICD-10-CM | POA: Diagnosis not present

## 2016-03-16 DIAGNOSIS — H401132 Primary open-angle glaucoma, bilateral, moderate stage: Secondary | ICD-10-CM | POA: Diagnosis not present

## 2016-04-27 DIAGNOSIS — H401132 Primary open-angle glaucoma, bilateral, moderate stage: Secondary | ICD-10-CM | POA: Diagnosis not present

## 2016-09-08 DIAGNOSIS — M1990 Unspecified osteoarthritis, unspecified site: Secondary | ICD-10-CM | POA: Diagnosis not present

## 2016-09-08 DIAGNOSIS — I10 Essential (primary) hypertension: Secondary | ICD-10-CM | POA: Diagnosis not present

## 2016-11-04 DIAGNOSIS — Z23 Encounter for immunization: Secondary | ICD-10-CM | POA: Diagnosis not present

## 2016-11-29 DIAGNOSIS — H04123 Dry eye syndrome of bilateral lacrimal glands: Secondary | ICD-10-CM | POA: Diagnosis not present

## 2016-11-29 DIAGNOSIS — Z961 Presence of intraocular lens: Secondary | ICD-10-CM | POA: Diagnosis not present

## 2016-11-29 DIAGNOSIS — H401132 Primary open-angle glaucoma, bilateral, moderate stage: Secondary | ICD-10-CM | POA: Diagnosis not present

## 2017-03-13 DIAGNOSIS — N183 Chronic kidney disease, stage 3 (moderate): Secondary | ICD-10-CM | POA: Diagnosis not present

## 2017-03-13 DIAGNOSIS — E559 Vitamin D deficiency, unspecified: Secondary | ICD-10-CM | POA: Diagnosis not present

## 2017-03-13 DIAGNOSIS — H409 Unspecified glaucoma: Secondary | ICD-10-CM | POA: Diagnosis not present

## 2017-03-13 DIAGNOSIS — M1990 Unspecified osteoarthritis, unspecified site: Secondary | ICD-10-CM | POA: Diagnosis not present

## 2017-03-13 DIAGNOSIS — I10 Essential (primary) hypertension: Secondary | ICD-10-CM | POA: Diagnosis not present

## 2017-03-13 DIAGNOSIS — M810 Age-related osteoporosis without current pathological fracture: Secondary | ICD-10-CM | POA: Diagnosis not present

## 2017-03-13 DIAGNOSIS — Z79899 Other long term (current) drug therapy: Secondary | ICD-10-CM | POA: Diagnosis not present

## 2017-03-20 DIAGNOSIS — Z6829 Body mass index (BMI) 29.0-29.9, adult: Secondary | ICD-10-CM | POA: Diagnosis not present

## 2017-03-20 DIAGNOSIS — M1990 Unspecified osteoarthritis, unspecified site: Secondary | ICD-10-CM | POA: Diagnosis not present

## 2017-03-20 DIAGNOSIS — N183 Chronic kidney disease, stage 3 (moderate): Secondary | ICD-10-CM | POA: Diagnosis not present

## 2017-03-20 DIAGNOSIS — I10 Essential (primary) hypertension: Secondary | ICD-10-CM | POA: Diagnosis not present

## 2017-04-27 DIAGNOSIS — H04123 Dry eye syndrome of bilateral lacrimal glands: Secondary | ICD-10-CM | POA: Diagnosis not present

## 2017-04-27 DIAGNOSIS — H353131 Nonexudative age-related macular degeneration, bilateral, early dry stage: Secondary | ICD-10-CM | POA: Diagnosis not present

## 2017-04-27 DIAGNOSIS — Z961 Presence of intraocular lens: Secondary | ICD-10-CM | POA: Diagnosis not present

## 2017-04-27 DIAGNOSIS — H401134 Primary open-angle glaucoma, bilateral, indeterminate stage: Secondary | ICD-10-CM | POA: Diagnosis not present

## 2017-10-02 DIAGNOSIS — I10 Essential (primary) hypertension: Secondary | ICD-10-CM | POA: Diagnosis not present

## 2017-10-02 DIAGNOSIS — M1990 Unspecified osteoarthritis, unspecified site: Secondary | ICD-10-CM | POA: Diagnosis not present

## 2017-10-20 DIAGNOSIS — Z23 Encounter for immunization: Secondary | ICD-10-CM | POA: Diagnosis not present

## 2017-11-02 DIAGNOSIS — H532 Diplopia: Secondary | ICD-10-CM | POA: Diagnosis not present

## 2017-11-02 DIAGNOSIS — H353131 Nonexudative age-related macular degeneration, bilateral, early dry stage: Secondary | ICD-10-CM | POA: Diagnosis not present

## 2017-11-02 DIAGNOSIS — H04123 Dry eye syndrome of bilateral lacrimal glands: Secondary | ICD-10-CM | POA: Diagnosis not present

## 2017-11-02 DIAGNOSIS — H5022 Vertical strabismus, left eye: Secondary | ICD-10-CM | POA: Diagnosis not present

## 2017-11-02 DIAGNOSIS — H401134 Primary open-angle glaucoma, bilateral, indeterminate stage: Secondary | ICD-10-CM | POA: Diagnosis not present

## 2017-11-02 DIAGNOSIS — Z961 Presence of intraocular lens: Secondary | ICD-10-CM | POA: Diagnosis not present

## 2018-03-29 DIAGNOSIS — H26492 Other secondary cataract, left eye: Secondary | ICD-10-CM | POA: Diagnosis not present

## 2018-03-29 DIAGNOSIS — H04123 Dry eye syndrome of bilateral lacrimal glands: Secondary | ICD-10-CM | POA: Diagnosis not present

## 2018-03-29 DIAGNOSIS — Z961 Presence of intraocular lens: Secondary | ICD-10-CM | POA: Diagnosis not present

## 2018-03-29 DIAGNOSIS — H532 Diplopia: Secondary | ICD-10-CM | POA: Diagnosis not present

## 2018-03-29 DIAGNOSIS — H401132 Primary open-angle glaucoma, bilateral, moderate stage: Secondary | ICD-10-CM | POA: Diagnosis not present

## 2018-03-29 DIAGNOSIS — H353131 Nonexudative age-related macular degeneration, bilateral, early dry stage: Secondary | ICD-10-CM | POA: Diagnosis not present

## 2018-04-02 DIAGNOSIS — M1990 Unspecified osteoarthritis, unspecified site: Secondary | ICD-10-CM | POA: Diagnosis not present

## 2018-04-02 DIAGNOSIS — Z79899 Other long term (current) drug therapy: Secondary | ICD-10-CM | POA: Diagnosis not present

## 2018-04-02 DIAGNOSIS — N183 Chronic kidney disease, stage 3 (moderate): Secondary | ICD-10-CM | POA: Diagnosis not present

## 2018-04-02 DIAGNOSIS — I10 Essential (primary) hypertension: Secondary | ICD-10-CM | POA: Diagnosis not present

## 2018-04-09 DIAGNOSIS — Z6826 Body mass index (BMI) 26.0-26.9, adult: Secondary | ICD-10-CM | POA: Diagnosis not present

## 2018-04-09 DIAGNOSIS — N183 Chronic kidney disease, stage 3 (moderate): Secondary | ICD-10-CM | POA: Diagnosis not present

## 2018-04-09 DIAGNOSIS — M1991 Primary osteoarthritis, unspecified site: Secondary | ICD-10-CM | POA: Diagnosis not present

## 2018-04-09 DIAGNOSIS — I129 Hypertensive chronic kidney disease with stage 1 through stage 4 chronic kidney disease, or unspecified chronic kidney disease: Secondary | ICD-10-CM | POA: Diagnosis not present

## 2018-08-31 DIAGNOSIS — S8012XA Contusion of left lower leg, initial encounter: Secondary | ICD-10-CM | POA: Diagnosis not present

## 2018-09-07 DIAGNOSIS — S8012XA Contusion of left lower leg, initial encounter: Secondary | ICD-10-CM | POA: Diagnosis not present

## 2018-10-10 DIAGNOSIS — M810 Age-related osteoporosis without current pathological fracture: Secondary | ICD-10-CM | POA: Diagnosis not present

## 2018-10-10 DIAGNOSIS — I10 Essential (primary) hypertension: Secondary | ICD-10-CM | POA: Diagnosis not present

## 2018-10-16 DIAGNOSIS — Z23 Encounter for immunization: Secondary | ICD-10-CM | POA: Diagnosis not present

## 2018-10-17 DIAGNOSIS — Z961 Presence of intraocular lens: Secondary | ICD-10-CM | POA: Diagnosis not present

## 2018-10-17 DIAGNOSIS — H353131 Nonexudative age-related macular degeneration, bilateral, early dry stage: Secondary | ICD-10-CM | POA: Diagnosis not present

## 2018-10-17 DIAGNOSIS — H401132 Primary open-angle glaucoma, bilateral, moderate stage: Secondary | ICD-10-CM | POA: Diagnosis not present

## 2018-10-17 DIAGNOSIS — H04123 Dry eye syndrome of bilateral lacrimal glands: Secondary | ICD-10-CM | POA: Diagnosis not present

## 2018-10-17 DIAGNOSIS — H26492 Other secondary cataract, left eye: Secondary | ICD-10-CM | POA: Diagnosis not present

## 2019-01-31 DIAGNOSIS — Z23 Encounter for immunization: Secondary | ICD-10-CM | POA: Diagnosis not present

## 2019-02-27 DIAGNOSIS — Z23 Encounter for immunization: Secondary | ICD-10-CM | POA: Diagnosis not present

## 2019-03-21 DIAGNOSIS — H353131 Nonexudative age-related macular degeneration, bilateral, early dry stage: Secondary | ICD-10-CM | POA: Diagnosis not present

## 2019-03-21 DIAGNOSIS — H401132 Primary open-angle glaucoma, bilateral, moderate stage: Secondary | ICD-10-CM | POA: Diagnosis not present

## 2019-04-02 DIAGNOSIS — I10 Essential (primary) hypertension: Secondary | ICD-10-CM | POA: Diagnosis not present

## 2019-04-02 DIAGNOSIS — H409 Unspecified glaucoma: Secondary | ICD-10-CM | POA: Diagnosis not present

## 2019-04-02 DIAGNOSIS — E559 Vitamin D deficiency, unspecified: Secondary | ICD-10-CM | POA: Diagnosis not present

## 2019-04-02 DIAGNOSIS — Z79899 Other long term (current) drug therapy: Secondary | ICD-10-CM | POA: Diagnosis not present

## 2019-04-02 DIAGNOSIS — M810 Age-related osteoporosis without current pathological fracture: Secondary | ICD-10-CM | POA: Diagnosis not present

## 2019-04-09 ENCOUNTER — Ambulatory Visit (HOSPITAL_COMMUNITY)
Admission: RE | Admit: 2019-04-09 | Discharge: 2019-04-09 | Disposition: A | Discharge status: Home / Self Care | Payer: Medicare Other | Source: Ambulatory Visit | Attending: Internal Medicine | Admitting: Internal Medicine

## 2019-04-09 ENCOUNTER — Other Ambulatory Visit (HOSPITAL_COMMUNITY): Payer: Self-pay | Admitting: Internal Medicine

## 2019-04-09 ENCOUNTER — Other Ambulatory Visit: Payer: Self-pay

## 2019-04-09 DIAGNOSIS — M25572 Pain in left ankle and joints of left foot: Secondary | ICD-10-CM

## 2019-04-09 DIAGNOSIS — I493 Ventricular premature depolarization: Secondary | ICD-10-CM | POA: Diagnosis not present

## 2019-04-09 DIAGNOSIS — M79672 Pain in left foot: Secondary | ICD-10-CM | POA: Diagnosis not present

## 2019-04-09 DIAGNOSIS — M19072 Primary osteoarthritis, left ankle and foot: Secondary | ICD-10-CM | POA: Diagnosis not present

## 2019-04-09 DIAGNOSIS — I10 Essential (primary) hypertension: Secondary | ICD-10-CM | POA: Diagnosis not present

## 2019-04-09 DIAGNOSIS — N1832 Chronic kidney disease, stage 3b: Secondary | ICD-10-CM | POA: Diagnosis not present

## 2019-04-09 DIAGNOSIS — R600 Localized edema: Secondary | ICD-10-CM | POA: Diagnosis not present

## 2019-04-12 DIAGNOSIS — M25579 Pain in unspecified ankle and joints of unspecified foot: Secondary | ICD-10-CM | POA: Diagnosis not present

## 2019-10-03 DIAGNOSIS — Z961 Presence of intraocular lens: Secondary | ICD-10-CM | POA: Diagnosis not present

## 2019-10-03 DIAGNOSIS — H532 Diplopia: Secondary | ICD-10-CM | POA: Diagnosis not present

## 2019-10-03 DIAGNOSIS — H26492 Other secondary cataract, left eye: Secondary | ICD-10-CM | POA: Diagnosis not present

## 2019-10-03 DIAGNOSIS — H04123 Dry eye syndrome of bilateral lacrimal glands: Secondary | ICD-10-CM | POA: Diagnosis not present

## 2019-10-03 DIAGNOSIS — H353132 Nonexudative age-related macular degeneration, bilateral, intermediate dry stage: Secondary | ICD-10-CM | POA: Diagnosis not present

## 2019-10-03 DIAGNOSIS — H401132 Primary open-angle glaucoma, bilateral, moderate stage: Secondary | ICD-10-CM | POA: Diagnosis not present

## 2019-10-09 DIAGNOSIS — M150 Primary generalized (osteo)arthritis: Secondary | ICD-10-CM | POA: Diagnosis not present

## 2019-10-09 DIAGNOSIS — I10 Essential (primary) hypertension: Secondary | ICD-10-CM | POA: Diagnosis not present

## 2019-10-22 DIAGNOSIS — Z23 Encounter for immunization: Secondary | ICD-10-CM | POA: Diagnosis not present

## 2019-11-11 DIAGNOSIS — Z23 Encounter for immunization: Secondary | ICD-10-CM | POA: Diagnosis not present

## 2020-01-03 DIAGNOSIS — H353134 Nonexudative age-related macular degeneration, bilateral, advanced atrophic with subfoveal involvement: Secondary | ICD-10-CM | POA: Diagnosis not present

## 2020-04-07 DIAGNOSIS — Z79899 Other long term (current) drug therapy: Secondary | ICD-10-CM | POA: Diagnosis not present

## 2020-04-07 DIAGNOSIS — I10 Essential (primary) hypertension: Secondary | ICD-10-CM | POA: Diagnosis not present

## 2020-04-07 DIAGNOSIS — M1990 Unspecified osteoarthritis, unspecified site: Secondary | ICD-10-CM | POA: Diagnosis not present

## 2020-04-14 DIAGNOSIS — K449 Diaphragmatic hernia without obstruction or gangrene: Secondary | ICD-10-CM | POA: Diagnosis not present

## 2020-04-14 DIAGNOSIS — Z6824 Body mass index (BMI) 24.0-24.9, adult: Secondary | ICD-10-CM | POA: Diagnosis not present

## 2020-04-14 DIAGNOSIS — I493 Ventricular premature depolarization: Secondary | ICD-10-CM | POA: Diagnosis not present

## 2020-04-14 DIAGNOSIS — N1831 Chronic kidney disease, stage 3a: Secondary | ICD-10-CM | POA: Diagnosis not present

## 2020-04-14 DIAGNOSIS — I10 Essential (primary) hypertension: Secondary | ICD-10-CM | POA: Diagnosis not present

## 2020-04-16 DIAGNOSIS — H04123 Dry eye syndrome of bilateral lacrimal glands: Secondary | ICD-10-CM | POA: Diagnosis not present

## 2020-04-16 DIAGNOSIS — Z961 Presence of intraocular lens: Secondary | ICD-10-CM | POA: Diagnosis not present

## 2020-04-16 DIAGNOSIS — H26492 Other secondary cataract, left eye: Secondary | ICD-10-CM | POA: Diagnosis not present

## 2020-04-16 DIAGNOSIS — H532 Diplopia: Secondary | ICD-10-CM | POA: Diagnosis not present

## 2020-04-16 DIAGNOSIS — H353132 Nonexudative age-related macular degeneration, bilateral, intermediate dry stage: Secondary | ICD-10-CM | POA: Diagnosis not present

## 2020-04-16 DIAGNOSIS — H401132 Primary open-angle glaucoma, bilateral, moderate stage: Secondary | ICD-10-CM | POA: Diagnosis not present

## 2020-05-13 DIAGNOSIS — Z23 Encounter for immunization: Secondary | ICD-10-CM | POA: Diagnosis not present

## 2020-08-04 DIAGNOSIS — H353122 Nonexudative age-related macular degeneration, left eye, intermediate dry stage: Secondary | ICD-10-CM | POA: Diagnosis not present

## 2020-10-08 DIAGNOSIS — H3530 Unspecified macular degeneration: Secondary | ICD-10-CM | POA: Diagnosis not present

## 2020-10-15 DIAGNOSIS — Z23 Encounter for immunization: Secondary | ICD-10-CM | POA: Diagnosis not present

## 2020-10-15 DIAGNOSIS — I10 Essential (primary) hypertension: Secondary | ICD-10-CM | POA: Diagnosis not present

## 2020-10-15 DIAGNOSIS — M1990 Unspecified osteoarthritis, unspecified site: Secondary | ICD-10-CM | POA: Diagnosis not present

## 2020-10-22 DIAGNOSIS — H26492 Other secondary cataract, left eye: Secondary | ICD-10-CM | POA: Diagnosis not present

## 2020-10-22 DIAGNOSIS — H401132 Primary open-angle glaucoma, bilateral, moderate stage: Secondary | ICD-10-CM | POA: Diagnosis not present

## 2020-10-22 DIAGNOSIS — H04123 Dry eye syndrome of bilateral lacrimal glands: Secondary | ICD-10-CM | POA: Diagnosis not present

## 2020-10-22 DIAGNOSIS — H532 Diplopia: Secondary | ICD-10-CM | POA: Diagnosis not present

## 2020-10-22 DIAGNOSIS — Z961 Presence of intraocular lens: Secondary | ICD-10-CM | POA: Diagnosis not present

## 2020-10-22 DIAGNOSIS — H353132 Nonexudative age-related macular degeneration, bilateral, intermediate dry stage: Secondary | ICD-10-CM | POA: Diagnosis not present

## 2020-11-12 DIAGNOSIS — Z23 Encounter for immunization: Secondary | ICD-10-CM | POA: Diagnosis not present

## 2021-01-26 DIAGNOSIS — H9313 Tinnitus, bilateral: Secondary | ICD-10-CM | POA: Diagnosis not present

## 2021-01-26 DIAGNOSIS — H6123 Impacted cerumen, bilateral: Secondary | ICD-10-CM | POA: Diagnosis not present

## 2021-02-08 DIAGNOSIS — H903 Sensorineural hearing loss, bilateral: Secondary | ICD-10-CM | POA: Diagnosis not present

## 2021-02-08 DIAGNOSIS — H838X3 Other specified diseases of inner ear, bilateral: Secondary | ICD-10-CM | POA: Diagnosis not present

## 2021-02-08 DIAGNOSIS — H6123 Impacted cerumen, bilateral: Secondary | ICD-10-CM | POA: Diagnosis not present

## 2021-02-08 DIAGNOSIS — H9313 Tinnitus, bilateral: Secondary | ICD-10-CM | POA: Diagnosis not present

## 2021-02-12 DIAGNOSIS — U099 Post covid-19 condition, unspecified: Secondary | ICD-10-CM | POA: Diagnosis not present

## 2021-04-05 DIAGNOSIS — N1831 Chronic kidney disease, stage 3a: Secondary | ICD-10-CM | POA: Diagnosis not present

## 2021-04-05 DIAGNOSIS — H409 Unspecified glaucoma: Secondary | ICD-10-CM | POA: Diagnosis not present

## 2021-04-05 DIAGNOSIS — I10 Essential (primary) hypertension: Secondary | ICD-10-CM | POA: Diagnosis not present

## 2021-04-05 DIAGNOSIS — H3530 Unspecified macular degeneration: Secondary | ICD-10-CM | POA: Diagnosis not present

## 2021-04-05 DIAGNOSIS — M1990 Unspecified osteoarthritis, unspecified site: Secondary | ICD-10-CM | POA: Diagnosis not present

## 2021-04-05 DIAGNOSIS — M810 Age-related osteoporosis without current pathological fracture: Secondary | ICD-10-CM | POA: Diagnosis not present

## 2021-04-05 DIAGNOSIS — Z79899 Other long term (current) drug therapy: Secondary | ICD-10-CM | POA: Diagnosis not present

## 2021-04-16 DIAGNOSIS — I10 Essential (primary) hypertension: Secondary | ICD-10-CM | POA: Diagnosis not present

## 2021-04-16 DIAGNOSIS — Z6824 Body mass index (BMI) 24.0-24.9, adult: Secondary | ICD-10-CM | POA: Diagnosis not present

## 2021-04-16 DIAGNOSIS — N1831 Chronic kidney disease, stage 3a: Secondary | ICD-10-CM | POA: Diagnosis not present

## 2021-04-16 DIAGNOSIS — M810 Age-related osteoporosis without current pathological fracture: Secondary | ICD-10-CM | POA: Diagnosis not present

## 2021-04-16 DIAGNOSIS — I493 Ventricular premature depolarization: Secondary | ICD-10-CM | POA: Diagnosis not present

## 2021-05-14 DIAGNOSIS — M7521 Bicipital tendinitis, right shoulder: Secondary | ICD-10-CM | POA: Diagnosis not present

## 2021-05-14 DIAGNOSIS — D1720 Benign lipomatous neoplasm of skin and subcutaneous tissue of unspecified limb: Secondary | ICD-10-CM | POA: Diagnosis not present

## 2021-10-15 ENCOUNTER — Other Ambulatory Visit: Payer: Self-pay | Admitting: Internal Medicine

## 2021-10-15 ENCOUNTER — Other Ambulatory Visit (HOSPITAL_COMMUNITY): Payer: Self-pay | Admitting: Internal Medicine

## 2021-10-15 DIAGNOSIS — R109 Unspecified abdominal pain: Secondary | ICD-10-CM | POA: Diagnosis not present

## 2021-10-15 DIAGNOSIS — R1084 Generalized abdominal pain: Secondary | ICD-10-CM

## 2021-10-15 DIAGNOSIS — I10 Essential (primary) hypertension: Secondary | ICD-10-CM | POA: Diagnosis not present

## 2021-10-27 DIAGNOSIS — H353132 Nonexudative age-related macular degeneration, bilateral, intermediate dry stage: Secondary | ICD-10-CM | POA: Diagnosis not present

## 2021-10-27 DIAGNOSIS — Z961 Presence of intraocular lens: Secondary | ICD-10-CM | POA: Diagnosis not present

## 2021-10-27 DIAGNOSIS — H401132 Primary open-angle glaucoma, bilateral, moderate stage: Secondary | ICD-10-CM | POA: Diagnosis not present

## 2021-10-27 DIAGNOSIS — H26492 Other secondary cataract, left eye: Secondary | ICD-10-CM | POA: Diagnosis not present

## 2021-10-27 DIAGNOSIS — H04123 Dry eye syndrome of bilateral lacrimal glands: Secondary | ICD-10-CM | POA: Diagnosis not present

## 2021-10-27 DIAGNOSIS — H532 Diplopia: Secondary | ICD-10-CM | POA: Diagnosis not present

## 2021-11-23 ENCOUNTER — Ambulatory Visit (HOSPITAL_COMMUNITY)
Admission: RE | Admit: 2021-11-23 | Discharge: 2021-11-23 | Disposition: A | Discharge status: Home / Self Care | Payer: Medicare Other | Source: Ambulatory Visit | Attending: Internal Medicine | Admitting: Internal Medicine

## 2021-11-23 DIAGNOSIS — R1084 Generalized abdominal pain: Secondary | ICD-10-CM | POA: Diagnosis not present

## 2021-11-23 DIAGNOSIS — K8020 Calculus of gallbladder without cholecystitis without obstruction: Secondary | ICD-10-CM | POA: Diagnosis not present

## 2021-11-23 DIAGNOSIS — K5730 Diverticulosis of large intestine without perforation or abscess without bleeding: Secondary | ICD-10-CM | POA: Diagnosis not present

## 2021-11-29 DIAGNOSIS — Q400 Congenital hypertrophic pyloric stenosis: Secondary | ICD-10-CM | POA: Diagnosis not present

## 2021-11-29 DIAGNOSIS — I700 Atherosclerosis of aorta: Secondary | ICD-10-CM | POA: Diagnosis not present

## 2021-11-29 DIAGNOSIS — K8000 Calculus of gallbladder with acute cholecystitis without obstruction: Secondary | ICD-10-CM | POA: Diagnosis not present

## 2022-01-03 DIAGNOSIS — R1084 Generalized abdominal pain: Secondary | ICD-10-CM | POA: Diagnosis not present

## 2022-01-03 DIAGNOSIS — K449 Diaphragmatic hernia without obstruction or gangrene: Secondary | ICD-10-CM | POA: Diagnosis not present

## 2022-01-25 DIAGNOSIS — R634 Abnormal weight loss: Secondary | ICD-10-CM | POA: Insufficient documentation

## 2022-01-25 DIAGNOSIS — H3530 Unspecified macular degeneration: Secondary | ICD-10-CM | POA: Insufficient documentation

## 2022-01-25 DIAGNOSIS — R079 Chest pain, unspecified: Secondary | ICD-10-CM | POA: Insufficient documentation

## 2022-01-25 DIAGNOSIS — R1310 Dysphagia, unspecified: Secondary | ICD-10-CM

## 2022-01-25 DIAGNOSIS — K440 Diaphragmatic hernia with obstruction, without gangrene: Secondary | ICD-10-CM | POA: Diagnosis not present

## 2022-01-25 DIAGNOSIS — H9193 Unspecified hearing loss, bilateral: Secondary | ICD-10-CM | POA: Diagnosis present

## 2022-01-25 DIAGNOSIS — K7689 Other specified diseases of liver: Secondary | ICD-10-CM | POA: Diagnosis not present

## 2022-02-22 ENCOUNTER — Telehealth: Payer: Self-pay | Admitting: Gastroenterology

## 2022-02-22 ENCOUNTER — Other Ambulatory Visit: Payer: Self-pay

## 2022-02-22 DIAGNOSIS — K440 Diaphragmatic hernia with obstruction, without gangrene: Secondary | ICD-10-CM

## 2022-02-22 DIAGNOSIS — R1319 Other dysphagia: Secondary | ICD-10-CM

## 2022-02-22 DIAGNOSIS — R079 Chest pain, unspecified: Secondary | ICD-10-CM

## 2022-02-22 NOTE — Telephone Encounter (Signed)
PT has an EM scheduled for 07/20/2022 and says that this is too long for her to wait. Her stomach is so swollen it is expanding to her chest causing great pain. She needs to find out If [facility] has any earlier appointments. Please advise.

## 2022-02-28 ENCOUNTER — Other Ambulatory Visit: Payer: Self-pay

## 2022-02-28 NOTE — Telephone Encounter (Signed)
No sooner openings on the [facility] Endo schedule. Patient has contacted her surgeon.

## 2022-03-02 ENCOUNTER — Ambulatory Visit: Payer: Medicare Other | Attending: Internal Medicine | Admitting: Internal Medicine

## 2022-03-02 ENCOUNTER — Encounter: Payer: Self-pay | Admitting: *Deleted

## 2022-03-02 ENCOUNTER — Encounter: Payer: Self-pay | Admitting: Internal Medicine

## 2022-03-02 VITALS — BP 143/85 | Ht 61.0 in | Wt 117.0 lb

## 2022-03-02 DIAGNOSIS — R0602 Shortness of breath: Secondary | ICD-10-CM | POA: Diagnosis not present

## 2022-03-02 DIAGNOSIS — Z01810 Encounter for preprocedural cardiovascular examination: Secondary | ICD-10-CM | POA: Insufficient documentation

## 2022-03-02 DIAGNOSIS — M7989 Other specified soft tissue disorders: Secondary | ICD-10-CM | POA: Insufficient documentation

## 2022-03-02 DIAGNOSIS — R079 Chest pain, unspecified: Secondary | ICD-10-CM | POA: Insufficient documentation

## 2022-03-02 NOTE — Progress Notes (Signed)
Cardiology Office Note  Date: 03/02/2022   ID: Kristen Marshall, DOB 1925/01/29, MRN [id number]  PCP:  Asencion Noble, MD  Cardiologist:  Chalmers Guest, MD Electrophysiologist:  None   Reason for Office Visit: Preop cardiac risk stratification for incarcerated hernia repair   History of Present Illness: Kristen Marshall is a 89 y.o. female known to have HTN, macular degeneration of eyes, incarcerated hernia was referred to cardiology clinic for preop cardiac risk stratification for incarcerated hernia repair.  Patient has a large hiatal hernia with her entire stomach and part of the transverse colon incarcerated into the mediastinum feeling much of it. Patient denies any chest pain but mainly complains of some indigestion symptoms but goes away when she belches.  Otherwise denies any angina, DOE but she gets out of breath at the end of the exertion and after sitting.  Denies any dizziness, lightheadedness, syncope. Has chronic bilateral leg swelling  Past Medical History:  Diagnosis Date   Arthritis    Glaucoma    HOH (hard of hearing)    HTN (hypertension)     Past Surgical History:  Procedure Laterality Date   COLONOSCOPY     COLONOSCOPY  03/17/2011   Procedure: COLONOSCOPY;  Surgeon: Rogene Houston, MD;  Location: [facility];  Service: Endoscopy;  Laterality: N/A;  130   HIP SURGERY  x 2 hip replacements   JOINT REPLACEMENT  x 2 hips   UPPER GASTROINTESTINAL ENDOSCOPY     WRIST FRACTURE SURGERY     left    Current Outpatient Medications  Medication Sig Dispense Refill   Carboxymethylcellulose Sodium (LUBRICANT EYE DROPS OP) Apply to eye. 1-2 drops in each eye twice daily     dorzolamide-timolol (COSOPT) 2-0.5 % ophthalmic solution 1 drop 2 (two) times daily.     hydrochlorothiazide 25 MG tablet Take 25 mg by mouth daily.       hyoscyamine (LEVSIN) 0.125 MG tablet Take by mouth at bedtime as needed for cramping.     ibuprofen (ADVIL,MOTRIN) 600 MG tablet Take 600 mg  by mouth as needed.      latanoprost (XALATAN) 0.005 % ophthalmic solution SMARTSIG:In Eye(s)     LISINOPRIL PO Take 20 mg by mouth daily.      Multiple Vitamins-Calcium TABS Take by mouth daily.     No current facility-administered medications for this visit.   Allergies:  Codeine   Social History: The patient  reports that she has never smoked. She has never used smokeless tobacco. She reports that she does not drink alcohol and does not use drugs.   Family History: The patient's family history includes Colon cancer in her father; Healthy in her daughter, daughter, son, and son; Pneumonia in her mother.   ROS:  Please see the history of present illness. Otherwise, complete review of systems is positive for none.  All other systems are reviewed and negative.   Physical Exam: VS:  BP (!) 143/85   Ht 5' 1"$  (1.549 m)   Wt 117 lb (53.1 kg)   SpO2 100%   BMI 22.11 kg/m , BMI Body mass index is 22.11 kg/m.  Wt Readings from Last 3 Encounters:  03/02/22 117 lb (53.1 kg)  03/17/11 148 lb (67.1 kg)  02/22/11 148 lb (67.1 kg)    General: Patient appears comfortable at rest. HEENT: Conjunctiva and lids normal, oropharynx clear with moist mucosa. Neck: Supple, no elevated JVP or carotid bruits, no thyromegaly. Lungs: Clear to auscultation, nonlabored breathing  at rest. Cardiac: Regular rate and rhythm, no S3 or significant systolic murmur, no pericardial rub. Abdomen: Soft, nontender, no hepatomegaly, bowel sounds present, no guarding or rebound. Extremities: No pitting edema, distal pulses 2+. Skin: Warm and dry. Musculoskeletal: No kyphosis. Neuropsychiatric: Alert and oriented x3, affect grossly appropriate.  ECG:  An ECG dated 03/02/2022 was personally reviewed today and demonstrated:  Normal sinus rhythm with no ST changes  Recent Labwork: No results found for requested labs within last 365 days.  No results found for: "CHOL", "TRIG", "HDL", "CHOLHDL", "VLDL", "LDLCALC",  "LDLDIRECT"  Other Studies Reviewed Today:   Assessment and Plan: Patient is 89 year old F known to have HTN was referred to cardiology clinic for preop cardiac risk stratification for incarcerated hernia repair.  # Preop cardiac risk stratification for incarcerated hernia repair -Patient denied any chest pain or DOE. METs less than 4. Obtain nuclear stress test.  Physical examination is negative for any high-grade murmur. Does not need echocardiogram for this indication. EKG showed normal sinus rhythm and no ST-T changes. Given patient's age 89 years old, patient is considered to be at an elevated risk for any perioperative cardiac complications if she undergoes any intermediate to high risk surgery. If nuclear stress test is negative for any high risk findings, no further cardiac testing is indicated prior to proceeding with planned procedure.  # Chronic bilateral lower EXTR swelling -Mets limited due to vision and has minimal SOB after resting associated with chronic bilateral lower extremity swelling. Obtain 2D echocardiogram.  # HTN, controlled -Continue HCTZ 25 mg once daily and lisinopril 20 mg once daily. HTN management per PCP.  I have spent a total of 45 minutes with patient reviewing chart, EKGs, labs and examining patient as well as establishing an assessment and plan that was discussed with the patient.  > 50% of time was spent in direct patient care.     Medication Adjustments/Labs and Tests Ordered: Current medicines are reviewed at length with the patient today.  Concerns regarding medicines are outlined above.   Tests Ordered: Orders Placed This Encounter  Procedures   EKG 12-Lead    Medication Changes: No orders of the defined types were placed in this encounter.   Disposition:  Follow up  as needed  Signed Mykal Batiz Fidel Levy, MD, 03/02/2022 10:39 AM    [facility], [place], [place] [phone]

## 2022-03-02 NOTE — Patient Instructions (Addendum)
Medication Instructions:  Your physician recommends that you continue on your current medications as directed. Please refer to the Current Medication list given to you today.  Labwork: none  Testing/Procedures: Your physician has requested that you have an echocardiogram. Echocardiography is a painless test that uses sound waves to create images of your heart. It provides your doctor with information about the size and shape of your heart and how well your heart's chambers and valves are working. This procedure takes approximately one hour. There are no restrictions for this procedure. Please do NOT wear cologne, perfume, aftershave, or lotions (deodorant is allowed). Please arrive 15 minutes prior to your appointment time. Your physician has requested that you have a lexiscan myoview. For further information please visit [email or web address]. Please follow instruction sheet, as given.  Follow-Up: Your physician recommends that you schedule a follow-up appointment in: as needed  Any Other Special Instructions Will Be Listed Below (If Applicable).  If you need a refill on your cardiac medications before your next appointment, please call your pharmacy.

## 2022-03-10 ENCOUNTER — Ambulatory Visit: Payer: Medicare Other | Attending: Internal Medicine

## 2022-03-10 DIAGNOSIS — R0602 Shortness of breath: Secondary | ICD-10-CM | POA: Diagnosis not present

## 2022-03-10 LAB — ECHOCARDIOGRAM COMPLETE
AR max vel: 1.62 cm2
AV Area VTI: 1.59 cm2
AV Area mean vel: 1.64 cm2
AV Mean grad: 6 mmHg
AV Peak grad: 12.5 mmHg
Ao pk vel: 1.77 m/s
Area-P 1/2: 4.06 cm2
Calc EF: 70.6 %
P 1/2 time: 1599 msec
S' Lateral: 2.2 cm
Single Plane A2C EF: 67.7 %
Single Plane A4C EF: 70.3 %

## 2022-03-14 ENCOUNTER — Encounter (HOSPITAL_COMMUNITY)
Admission: RE | Admit: 2022-03-14 | Discharge: 2022-03-14 | Disposition: A | Discharge status: Home / Self Care | Payer: Medicare Other | Source: Ambulatory Visit | Attending: Internal Medicine | Admitting: Internal Medicine

## 2022-03-14 ENCOUNTER — Ambulatory Visit (HOSPITAL_COMMUNITY)
Admission: RE | Admit: 2022-03-14 | Discharge: 2022-03-14 | Disposition: A | Discharge status: Home / Self Care | Payer: Medicare Other | Source: Ambulatory Visit | Attending: Internal Medicine | Admitting: Internal Medicine

## 2022-03-14 DIAGNOSIS — Z01810 Encounter for preprocedural cardiovascular examination: Secondary | ICD-10-CM | POA: Diagnosis not present

## 2022-03-14 DIAGNOSIS — R0602 Shortness of breath: Secondary | ICD-10-CM | POA: Diagnosis not present

## 2022-03-14 LAB — NM MYOCAR MULTI W/SPECT W/WALL MOTION / EF
LV dias vol: 35 mL (ref 46–106)
LV sys vol: 9 mL
Nuc Stress EF: 75 %
Peak HR: 92 {beats}/min
RATE: 0.4
Rest HR: 78 {beats}/min
Rest Nuclear Isotope Dose: 9.8 mCi
SDS: 1
SRS: 6
SSS: 7
ST Depression (mm): 0 mm
Stress Nuclear Isotope Dose: 30 mCi
TID: 1.17

## 2022-03-14 MED ORDER — TECHNETIUM TC 99M TETROFOSMIN IV KIT
10.0000 | PACK | Freq: Once | INTRAVENOUS | Status: AC | PRN
  Administered 2022-03-14: 9.8 via INTRAVENOUS

## 2022-03-14 MED ORDER — REGADENOSON 0.4 MG/5ML IV SOLN
INTRAVENOUS | Status: AC
  Administered 2022-03-14: 0.4 mg via INTRAVENOUS
  Filled 2022-03-14: qty 5

## 2022-03-14 MED ORDER — TECHNETIUM TC 99M TETROFOSMIN IV KIT
30.0000 | PACK | Freq: Once | INTRAVENOUS | Status: AC | PRN
  Administered 2022-03-14: 30 via INTRAVENOUS

## 2022-03-14 MED ORDER — SODIUM CHLORIDE FLUSH 0.9 % IV SOLN
INTRAVENOUS | Status: AC
  Administered 2022-03-14: 10 mL via INTRAVENOUS
  Filled 2022-03-14: qty 10

## 2022-03-15 ENCOUNTER — Ambulatory Visit: Payer: Self-pay | Admitting: Surgery

## 2022-03-16 IMAGING — DX DG ANKLE COMPLETE 3+V*L*
3 series · 3 of 3 positions shown · non-contrast
Comparison: None.

CLINICAL DATA: Left ankle pain of unspecified chronicity

EXAM:
LEFT ANKLE COMPLETE - 3+ VIEW

[ankle ap]
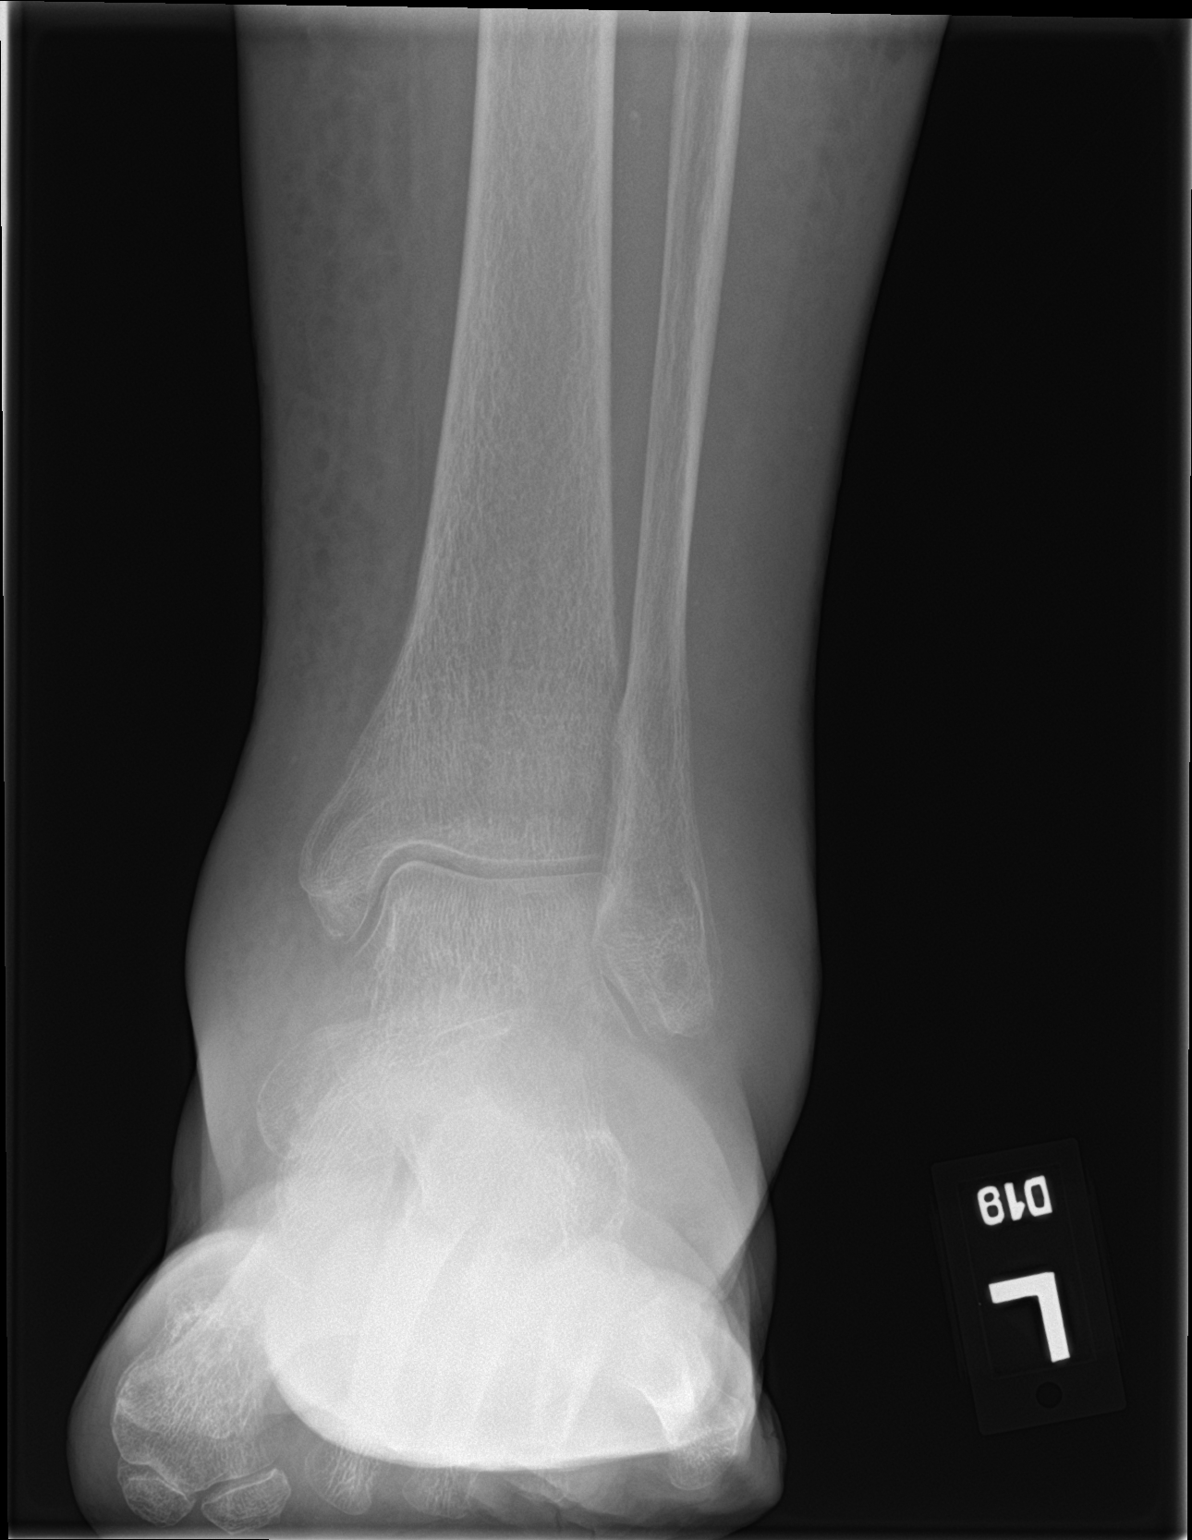

[ankle obl]
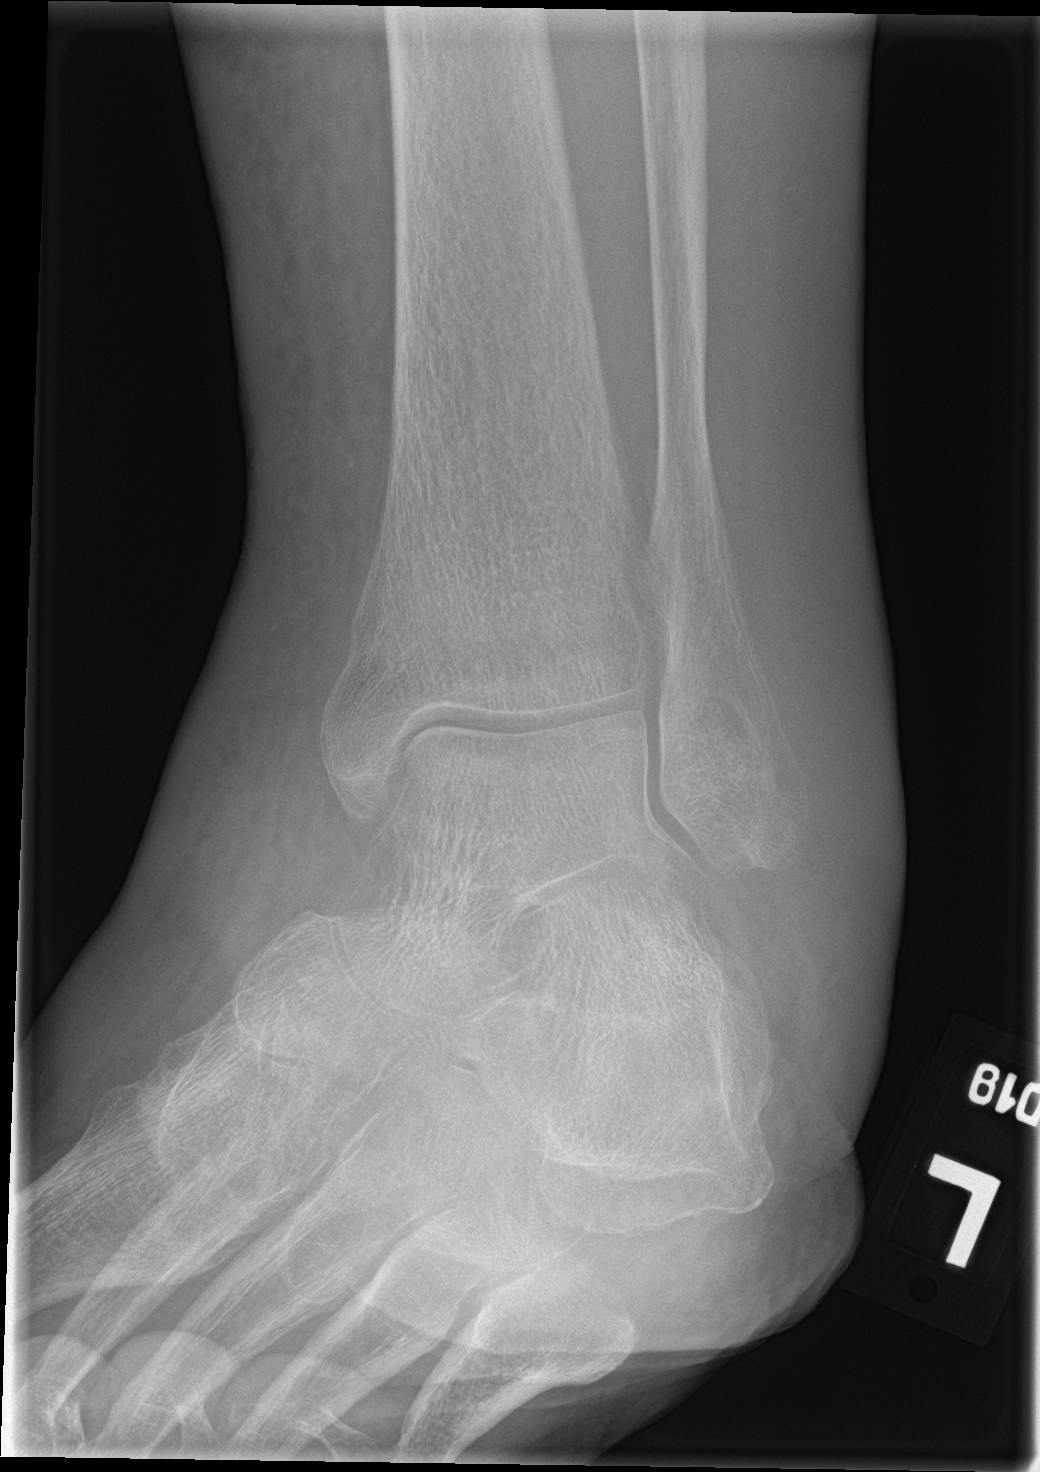

[ankle lat]
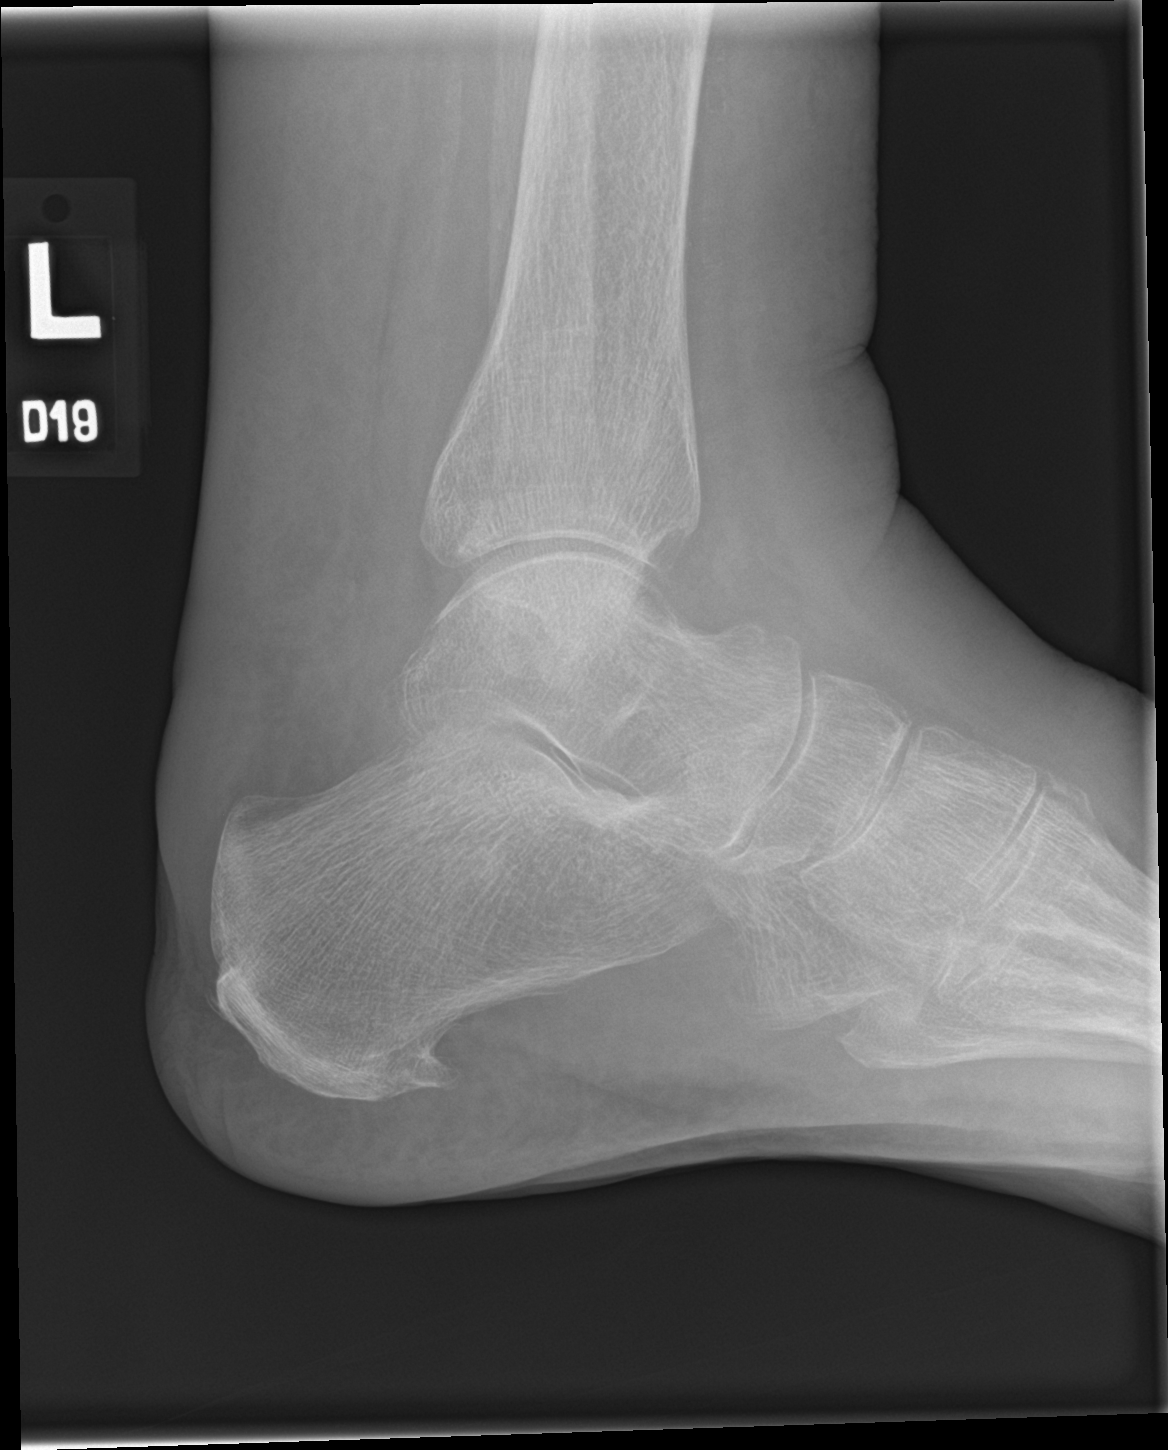

[3 of 3 positions shown; findings below may reference images not displayed]

FINDINGS: Frontal, oblique, and lateral views of the left ankle are obtained.
There is diffuse soft tissue edema. The bones are osteopenic. No
fracture, subluxation, or dislocation. Joint spaces are well
preserved. There is a small inferior calcaneal spur.
IMPRESSION: 1. Osteopenia.
2. Diffuse soft tissue edema.
3. No acute fracture.

## 2022-03-22 ENCOUNTER — Ambulatory Visit: Payer: Self-pay | Admitting: Surgery

## 2022-03-22 DIAGNOSIS — R1310 Dysphagia, unspecified: Secondary | ICD-10-CM | POA: Diagnosis not present

## 2022-03-22 DIAGNOSIS — R079 Chest pain, unspecified: Secondary | ICD-10-CM | POA: Diagnosis not present

## 2022-03-22 DIAGNOSIS — K440 Diaphragmatic hernia with obstruction, without gangrene: Secondary | ICD-10-CM | POA: Diagnosis not present

## 2022-03-22 DIAGNOSIS — R634 Abnormal weight loss: Secondary | ICD-10-CM | POA: Diagnosis not present

## 2022-04-14 DIAGNOSIS — Z79899 Other long term (current) drug therapy: Secondary | ICD-10-CM | POA: Diagnosis not present

## 2022-04-14 DIAGNOSIS — I10 Essential (primary) hypertension: Secondary | ICD-10-CM | POA: Diagnosis not present

## 2022-04-21 DIAGNOSIS — I10 Essential (primary) hypertension: Secondary | ICD-10-CM | POA: Diagnosis not present

## 2022-04-21 DIAGNOSIS — I700 Atherosclerosis of aorta: Secondary | ICD-10-CM | POA: Diagnosis not present

## 2022-04-21 DIAGNOSIS — Q401 Congenital hiatus hernia: Secondary | ICD-10-CM | POA: Diagnosis not present

## 2022-04-21 DIAGNOSIS — N1831 Chronic kidney disease, stage 3a: Secondary | ICD-10-CM | POA: Diagnosis not present

## 2022-05-02 ENCOUNTER — Other Ambulatory Visit (HOSPITAL_COMMUNITY): Payer: Self-pay | Admitting: Internal Medicine

## 2022-05-02 DIAGNOSIS — R059 Cough, unspecified: Secondary | ICD-10-CM

## 2022-05-03 ENCOUNTER — Ambulatory Visit (HOSPITAL_COMMUNITY)
Admission: RE | Admit: 2022-05-03 | Discharge: 2022-05-03 | Disposition: A | Discharge status: Home / Self Care | Payer: Medicare Other | Source: Ambulatory Visit | Attending: Internal Medicine | Admitting: Internal Medicine

## 2022-05-03 DIAGNOSIS — R059 Cough, unspecified: Secondary | ICD-10-CM | POA: Insufficient documentation

## 2022-05-03 DIAGNOSIS — K449 Diaphragmatic hernia without obstruction or gangrene: Secondary | ICD-10-CM | POA: Diagnosis not present

## 2022-05-03 DIAGNOSIS — J90 Pleural effusion, not elsewhere classified: Secondary | ICD-10-CM | POA: Diagnosis not present

## 2022-05-04 ENCOUNTER — Encounter (HOSPITAL_COMMUNITY): Payer: Self-pay | Admitting: Physician Assistant

## 2022-05-10 NOTE — Patient Instructions (Signed)
SURGICAL WAITING ROOM VISITATION  Patients having surgery or a procedure may have no more than 2 support people in the waiting area - these visitors may rotate.    Children under the age of 32 must have an adult with them who is not the patient.  Due to an increase in RSV and influenza rates and associated hospitalizations, children ages 91 and under may not visit patients in [facility] hospitals.  If the patient needs to stay at the hospital during part of their recovery, the visitor guidelines for inpatient rooms apply. Pre-op nurse will coordinate an appropriate time for 1 support person to accompany patient in pre-op.  This support person may not rotate.    Please refer to the [facility] website for the visitor guidelines for Inpatients (after your surgery is over and you are in a regular room).       Your procedure is scheduled on:  05/13/22    Report to [facility] Main Entrance    Report to admitting at  0515  Call this number if you have problems the morning of surgery [phone]   Do not eat food :After Midnight.   After Midnight you may have the following liquids until _  0430_____ AM  DAY OF SURGERY  Water Non-Citrus Juices (without pulp, NO RED-Apple, White grape, White cranberry) Black Coffee (NO MILK/CREAM OR CREAMERS, sugar ok)  Clear Tea (NO MILK/CREAM OR CREAMERS, sugar ok) regular and decaf                             Plain Jell-O (NO RED)                                           Fruit ices (not with fruit pulp, NO RED)                                     Popsicles (NO RED)                                                               Sports drinks like Gatorade (NO RED)              Drink 2 Ensure/G2 drinks AT 10:00 PM the night before surgery.        The day of surgery:  Drink ONE (1) Pre-Surgery Clear Ensure or G2 at 0430 am ( have completed by ) morning of surgery.  pre-op appointment visit. Nothing else to drink after completing the   Pre-Surgery Clear Ensure or G2.          If you have questions, please contact your surgeon's office.       Oral Hygiene is also important to reduce your risk of infection.                                    Remember - BRUSH YOUR TEETH THE MORNING OF SURGERY WITH YOUR REGULAR TOOTHPASTE  DENTURES WILL BE REMOVED  PRIOR TO SURGERY PLEASE DO NOT APPLY "Poly grip" OR ADHESIVES!!!   Do NOT smoke after Midnight   Take these medicines the morning of surgery with A SIP OF WATER:  eye drops as usual   DO NOT TAKE ANY ORAL DIABETIC MEDICATIONS DAY OF YOUR SURGERY  Bring CPAP mask and tubing day of surgery.                              You may not have any metal on your body including hair pins, jewelry, and body piercing             Do not wear make-up, lotions, powders, perfumes/cologne, or deodorant  Do not wear nail polish including gel and S&S, artificial/acrylic nails, or any other type of covering on natural nails including finger and toenails. If you have artificial nails, gel coating, etc. that needs to be removed by a nail salon please have this removed prior to surgery or surgery may need to be canceled/ delayed if the surgeon/ anesthesia feels like they are unable to be safely monitored.   Do not shave  48 hours prior to surgery.               Men may shave face and neck.   Do not bring valuables to the hospital. [place] IS NOT             RESPONSIBLE   FOR VALUABLES.   Contacts, glasses, dentures or bridgework may not be worn into surgery.   Bring small overnight bag day of surgery.   DO NOT BRING YOUR HOME MEDICATIONS TO THE HOSPITAL. PHARMACY WILL DISPENSE MEDICATIONS LISTED ON YOUR MEDICATION LIST TO YOU DURING YOUR ADMISSION IN THE HOSPITAL!    Patients discharged on the day of surgery will not be allowed to drive home.  Someone NEEDS to stay with you for the first 24 hours after anesthesia.   Special Instructions: Bring a copy of your healthcare power of  attorney and living will documents the day of surgery if you haven't scanned them before.              Please read over the following fact sheets you were given: IF YOU HAVE QUESTIONS ABOUT YOUR PRE-OP INSTRUCTIONS PLEASE CALL [phone]   If you received a COVID test during your pre-op visit  it is requested that you wear a mask when out in public, stay away from anyone that may not be feeling well and notify your surgeon if you develop symptoms. If you test positive for Covid or have been in contact with anyone that has tested positive in the last 10 days please notify you surgeon.    [facility] - Preparing for Surgery Before surgery, you can play an important role.  Because skin is not sterile, your skin needs to be as free of germs as possible.  You can reduce the number of germs on your skin by washing with CHG (chlorahexidine gluconate) soap before surgery.  CHG is an antiseptic cleaner which kills germs and bonds with the skin to continue killing germs even after washing. Please DO NOT use if you have an allergy to CHG or antibacterial soaps.  If your skin becomes reddened/irritated stop using the CHG and inform your nurse when you arrive at Short Stay. Do not shave (including legs and underarms) for at least 48 hours prior to the first CHG shower.  You may shave your  face/neck. Please follow these instructions carefully:  1.  Shower with CHG Soap the night before surgery and the  morning of Surgery.  2.  If you choose to wash your hair, wash your hair first as usual with your  normal  shampoo.  3.  After you shampoo, rinse your hair and body thoroughly to remove the  shampoo.                           4.  Use CHG as you would any other liquid soap.  You can apply chg directly  to the skin and wash                       Gently with a scrungie or clean washcloth.  5.  Apply the CHG Soap to your body ONLY FROM THE NECK DOWN.   Do not use on face/ open                           Wound or open  sores. Avoid contact with eyes, ears mouth and genitals (private parts).                       Wash face,  Genitals (private parts) with your normal soap.             6.  Wash thoroughly, paying special attention to the area where your surgery  will be performed.  7.  Thoroughly rinse your body with warm water from the neck down.  8.  DO NOT shower/wash with your normal soap after using and rinsing off  the CHG Soap.                9.  Pat yourself dry with a clean towel.            10.  Wear clean pajamas.            11.  Place clean sheets on your bed the night of your first shower and do not  sleep with pets. Day of Surgery : Do not apply any lotions/deodorants the morning of surgery.  Please wear clean clothes to the hospital/surgery center.  FAILURE TO FOLLOW THESE INSTRUCTIONS MAY RESULT IN THE CANCELLATION OF YOUR SURGERY PATIENT SIGNATURE_________________________________  NURSE SIGNATURE__________________________________  ________________________________________________________________________

## 2022-05-10 NOTE — Progress Notes (Signed)
Anesthesia Review:  PCP: Cardiologist : Chest x-ray : 05/03/22- 2 view  EKG : 03/02/22  Echo : 03/10/22  Stress test: 03/14/22  Cardiac Cath :  Activity level:  Sleep Study/ CPAP : Fasting Blood Sugar :      / Checks Blood Sugar -- times a day:   Blood Thinner/ Instructions /Last Dose: ASA / Instructions/ Last Dose :

## 2022-05-11 ENCOUNTER — Encounter (HOSPITAL_COMMUNITY)
Admission: RE | Admit: 2022-05-11 | Discharge: 2022-05-11 | Disposition: A | Discharge status: Home / Self Care | Payer: Medicare Other | Source: Ambulatory Visit | Attending: Internal Medicine | Admitting: Internal Medicine

## 2022-05-13 ENCOUNTER — Ambulatory Visit (HOSPITAL_COMMUNITY): Admission: RE | Admit: 2022-05-13 | Payer: Medicare Other | Source: Home / Self Care | Admitting: Surgery

## 2022-05-13 ENCOUNTER — Encounter (HOSPITAL_COMMUNITY): Admission: RE | Payer: Self-pay | Source: Home / Self Care

## 2022-05-13 SURGERY — REPAIR, HERNIA, PARAESOPHAGEAL, ROBOT-ASSISTED
Anesthesia: General

## 2022-06-03 ENCOUNTER — Ambulatory Visit: Payer: Self-pay | Admitting: Surgery

## 2022-06-03 NOTE — Progress Notes (Signed)
Sent message, via epic in basket, requesting orders in epic from surgeon.  

## 2022-06-09 NOTE — Patient Instructions (Signed)
DUE TO COVID-19 ONLY TWO VISITORS  (aged 87 and older)  ARE ALLOWED TO COME WITH YOU AND STAY IN THE WAITING ROOM ONLY DURING PRE OP AND PROCEDURE.   **NO VISITORS ARE ALLOWED IN THE SHORT STAY AREA OR RECOVERY ROOM!!**  IF YOU WILL BE ADMITTED INTO THE HOSPITAL YOU ARE ALLOWED ONLY FOUR SUPPORT PEOPLE DURING VISITATION HOURS ONLY (7 AM -8PM)   The support person(s) must pass our screening, gel in and out, and wear a mask at all times, including in the patient's room. Patients must also wear a mask when staff or their support person are in the room. Visitors GUEST BADGE MUST BE WORN VISIBLY  One adult visitor may remain with you overnight and MUST be in the room by 8 P.M.     Your procedure is scheduled on: 06/15/22   Report to [facility] Main Entrance    Report to admitting at: 9:45 AM   Call this number if you have problems the morning of surgery [phone]   Clear liquids starting the day before surgery until : 9:00 AM DAY OF SURGERY  Water Black Coffee (sugar ok, NO MILK/CREAM OR CREAMERS)  Tea (sugar ok, NO MILK/CREAM OR CREAMERS) regular and decaf                             Plain Jell-O (NO RED)                                           Fruit ices (not with fruit pulp, NO RED)                                     Popsicles (NO RED)                                                                  Juice: apple, WHITE grape, WHITE cranberry Sports drinks like Gatorade (NO RED)              Drink 2 Ensure drinks AT 10:00 PM the night before surgery.     The day of surgery:  Drink ONE (1) Pre-Surgery Clear Ensure at : 9:00 AM the morning of surgery. Drink in one sitting. Do not sip.  This drink was given to you during your hospital  pre-op appointment visit. Nothing else to drink after completing the  Pre-Surgery Clear Ensure or G2.          If you have questions, please contact your surgeon's office.  FOLLOW BOWEL PREP AND ANY ADDITIONAL PRE OP INSTRUCTIONS YOU  RECEIVED FROM YOUR SURGEON'S OFFICE!!!   Oral Hygiene is also important to reduce your risk of infection.                                    Remember - BRUSH YOUR TEETH THE MORNING OF SURGERY WITH YOUR REGULAR TOOTHPASTE  DENTURES WILL BE REMOVED PRIOR TO SURGERY PLEASE DO NOT APPLY "Poly  grip" OR ADHESIVES!!!   Do NOT smoke after Midnight   Take these medicines the morning of surgery with A SIP OF WATER: N/A. Use eye drops as usual.                              You may not have any metal on your body including hair pins, jewelry, and body piercing             Do not wear make-up, lotions, powders, perfumes/cologne, or deodorant  Do not wear nail polish including gel and S&S, artificial/acrylic nails, or any other type of covering on natural nails including finger and toenails. If you have artificial nails, gel coating, etc. that needs to be removed by a nail salon please have this removed prior to surgery or surgery may need to be canceled/ delayed if the surgeon/ anesthesia feels like they are unable to be safely monitored.   Do not shave  48 hours prior to surgery.    Do not bring valuables to the hospital. [place] IS NOT             RESPONSIBLE   FOR VALUABLES.   Contacts, glasses, or bridgework may not be worn into surgery.   Bring small overnight bag day of surgery.   DO NOT BRING YOUR HOME MEDICATIONS TO THE HOSPITAL. PHARMACY WILL DISPENSE MEDICATIONS LISTED ON YOUR MEDICATION LIST TO YOU DURING YOUR ADMISSION IN THE HOSPITAL!    Patients discharged on the day of surgery will not be allowed to drive home.  Someone NEEDS to stay with you for the first 24 hours after anesthesia.   Special Instructions: Bring a copy of your healthcare power of attorney and living will documents         the day of surgery if you haven't scanned them before.              Please read over the following fact sheets you were given: IF YOU HAVE QUESTIONS ABOUT YOUR PRE-OP INSTRUCTIONS PLEASE CALL  [phone]    [facility] - Preparing for Surgery Before surgery, you can play an important role.  Because skin is not sterile, your skin needs to be as free of germs as possible.  You can reduce the number of germs on your skin by washing with CHG (chlorahexidine gluconate) soap before surgery.  CHG is an antiseptic cleaner which kills germs and bonds with the skin to continue killing germs even after washing. Please DO NOT use if you have an allergy to CHG or antibacterial soaps.  If your skin becomes reddened/irritated stop using the CHG and inform your nurse when you arrive at Short Stay. Do not shave (including legs and underarms) for at least 48 hours prior to the first CHG shower.  You may shave your face/neck. Please follow these instructions carefully:  1.  Shower with CHG Soap the night before surgery and the  morning of Surgery.  2.  If you choose to wash your hair, wash your hair first as usual with your  normal  shampoo.  3.  After you shampoo, rinse your hair and body thoroughly to remove the  shampoo.                           4.  Use CHG as you would any other liquid soap.  You can apply chg directly  to the skin and  wash                       Gently with a scrungie or clean washcloth.  5.  Apply the CHG Soap to your body ONLY FROM THE NECK DOWN.   Do not use on face/ open                           Wound or open sores. Avoid contact with eyes, ears mouth and genitals (private parts).                       Wash face,  Genitals (private parts) with your normal soap.             6.  Wash thoroughly, paying special attention to the area where your surgery  will be performed.  7.  Thoroughly rinse your body with warm water from the neck down.  8.  DO NOT shower/wash with your normal soap after using and rinsing off  the CHG Soap.                9.  Pat yourself dry with a clean towel.            10.  Wear clean pajamas.            11.  Place clean sheets on your bed the night of your  first shower and do not  sleep with pets. Day of Surgery : Do not apply any lotions/deodorants the morning of surgery.  Please wear clean clothes to the hospital/surgery center.  FAILURE TO FOLLOW THESE INSTRUCTIONS MAY RESULT IN THE CANCELLATION OF YOUR SURGERY PATIENT SIGNATURE_________________________________  NURSE SIGNATURE__________________________________  ________________________________________________________________________

## 2022-06-10 ENCOUNTER — Encounter (HOSPITAL_COMMUNITY)
Admission: RE | Admit: 2022-06-10 | Discharge: 2022-06-10 | Disposition: A | Discharge status: Home / Self Care | Payer: Medicare Other | Source: Ambulatory Visit | Attending: Surgery | Admitting: Surgery

## 2022-06-10 ENCOUNTER — Encounter (HOSPITAL_COMMUNITY): Payer: Self-pay

## 2022-06-10 ENCOUNTER — Other Ambulatory Visit: Payer: Self-pay

## 2022-06-10 VITALS — BP 134/76 | HR 79 | Temp 97.7°F | Ht 61.0 in | Wt 114.0 lb

## 2022-06-10 DIAGNOSIS — K449 Diaphragmatic hernia without obstruction or gangrene: Secondary | ICD-10-CM | POA: Diagnosis not present

## 2022-06-10 DIAGNOSIS — I10 Essential (primary) hypertension: Secondary | ICD-10-CM | POA: Diagnosis not present

## 2022-06-10 DIAGNOSIS — Z01818 Encounter for other preprocedural examination: Secondary | ICD-10-CM | POA: Diagnosis not present

## 2022-06-10 LAB — BASIC METABOLIC PANEL
Anion gap: 10 (ref 5–15)
BUN: 40 mg/dL — ABNORMAL HIGH (ref 8–23)
CO2: 26 mmol/L (ref 22–32)
Calcium: 9 mg/dL (ref 8.9–10.3)
Chloride: 96 mmol/L — ABNORMAL LOW (ref 98–111)
Creatinine, Ser: 1.2 mg/dL — ABNORMAL HIGH (ref 0.44–1.00)
GFR, Estimated: 42 mL/min — ABNORMAL LOW (ref >60)
Glucose, Bld: 104 mg/dL — ABNORMAL HIGH (ref 70–99)
Potassium: 4 mmol/L (ref 3.5–5.1)
Sodium: 132 mmol/L — ABNORMAL LOW (ref 135–145)

## 2022-06-10 LAB — CBC
HCT: 39.5 % (ref 36.0–46.0)
Hemoglobin: 13.1 g/dL (ref 12.0–15.0)
MCH: 30.8 pg (ref 26.0–34.0)
MCHC: 33.2 g/dL (ref 30.0–36.0)
MCV: 92.9 fL (ref 80.0–100.0)
Platelets: 244 10*3/uL (ref 150–400)
RBC: 4.25 MIL/uL (ref 3.87–5.11)
RDW: 12.3 % (ref 11.5–15.5)
WBC: 4.9 10*3/uL (ref 4.0–10.5)
nRBC: 0 % (ref 0.0–0.2)

## 2022-06-10 NOTE — Progress Notes (Signed)
For Short Stay: COVID SWAB appointment date:  Bowel Prep reminder:   For Anesthesia: PCP - Dr. Carylon Perches Cardiologist - Dr. Luane School. LOV: 03/02/22: Clearance.  Chest x-ray - 05/05/22 EKG - 03/02/22 Stress Test -  ECHO - 03/10/22 Cardiac Cath -  Pacemaker/ICD device last checked: Pacemaker orders received: Device Rep notified:  Spinal Cord Stimulator: N/A  Sleep Study - N/A CPAP -   Fasting Blood Sugar - N/A Checks Blood Sugar _____ times a day Date and result of last Hgb A1c-  Last dose of GLP1 agonist- N/A GLP1 instructions:   Last dose of SGLT-2 inhibitors- N/A SGLT-2 instructions:   Blood Thinner Instructions: N/A Aspirin Instructions: Last Dose:  Activity level: Can go up a flight of stairs and activities of daily living without stopping and without chest pain and/or shortness of breath   Able to exercise without chest pain and/or shortness of breath  Anesthesia review: Hx: HTN  Patient denies shortness of breath, fever, cough and chest pain at PAT appointment   Patient verbalized understanding of instructions that were given to them at the PAT appointment. Patient was also instructed that they will need to review over the PAT instructions again at home before surgery.

## 2022-06-14 NOTE — Anesthesia Preprocedure Evaluation (Addendum)
Anesthesia Evaluation  Patient identified by MRN, date of birth, ID band Patient awake    Reviewed: Allergy & Precautions, NPO status , Patient's Chart, lab work & pertinent test results  Airway Mallampati: II  TM Distance: >3 FB Neck ROM: Full    Dental  (+) Missing   Pulmonary neg pulmonary ROS   Pulmonary exam normal        Cardiovascular hypertension, Pt. on medications Normal cardiovascular exam  ECHO: 1. Left ventricular ejection fraction, by estimation, is 60 to 65%. The left ventricle has normal function. The left ventricle has no regional wall motion abnormalities. Left ventricular diastolic parameters are consistent with Grade I diastolic dysfunction (impaired relaxation). Elevated left atrial pressure. The average left ventricular global longitudinal strain is -21.2 %. The global longitudinal strain is normal.  2. Right ventricular systolic function is normal. The right ventricular size is normal. There is normal pulmonary artery systolic pressure.  3. A small pericardial effusion is present. The pericardial effusion is circumferential.  4. The mitral valve is normal in structure. No evidence of mitral valve regurgitation. No evidence of mitral stenosis.  5. The tricuspid valve is abnormal. Tricuspid valve regurgitation is moderate.  6. The aortic valve is tricuspid. There is mild calcification of the aortic valve. There is mild thickening of the aortic valve. Aortic valve regurgitation is trivial. No aortic stenosis is present.  7. The inferior vena cava is normal in size with greater than 50% respiratory variability, suggesting right atrial pressure of 3 mmHg.    Neuro/Psych negative neurological ROS  negative psych ROS   GI/Hepatic negative GI ROS, Neg liver ROS,,,  Endo/Other  negative endocrine ROS    Renal/GU negative Renal ROS     Musculoskeletal  (+) Arthritis ,  Ambulates with cane   Abdominal   Peds   Hematology negative hematology ROS (+)   Anesthesia Other Findings PARAESOPHAGEAL HIATAL HERNIA  Reproductive/Obstetrics                             Anesthesia Physical Anesthesia Plan  ASA: 2  Anesthesia Plan: General   Post-op Pain Management:    Induction: Intravenous  PONV Risk Score and Plan: Ondansetron, Dexamethasone and Treatment may vary due to age or medical condition  Airway Management Planned: Oral ETT  Additional Equipment:   Intra-op Plan:   Post-operative Plan: Extubation in OR  Informed Consent: I have reviewed the patients History and Physical, chart, labs and discussed the procedure including the risks, benefits and alternatives for the proposed anesthesia with the patient or authorized representative who has indicated his/her understanding and acceptance.     Dental advisory given  Plan Discussed with: CRNA  Anesthesia Plan Comments: (PAT note 06/10/2022)       Anesthesia Quick Evaluation

## 2022-06-14 NOTE — Progress Notes (Signed)
Anesthesia Chart Review   Case: [id number] Date/Time: 06/15/22 1145   Procedure: XI ROBOTIC ASSISTED PARAESOPHAGEAL HIATAL HERNIA REPAIR WITH FUNDOPLICATION   Anesthesia type: General   Pre-op diagnosis: PARAESOPHAGEAL HIATAL HERNIA   Location: [facility] ROOM 02 / [facility]   Surgeons: Karie Soda, MD       DISCUSSION: 89 year old never smoker with history of hypertension, paraesophageal hiatal hernia scheduled for above procedure 06/15/2022 with Dr. Viviann Spare gross.  Patient seen by cardiology 03/02/2022 for preoperative evaluation.  Patient with bilateral chronic lower extremity edema, HTN controlled.  Per office visit note, "Patient denied any chest pain or DOE. METs less than 4. Obtain nuclear stress test.  Physical examination is negative for any high-grade murmur. Does not need echocardiogram for this indication. EKG showed normal sinus rhythm and no ST-T changes. Given patient's age 3 years old, patient is considered to be at an elevated risk for any perioperative cardiac complications if she undergoes any intermediate to high risk surgery. If nuclear stress test is negative for any high risk findings, no further cardiac testing is indicated prior to proceeding with planned procedure."  Low risk stress test 04/12/2022.  Per result comments, "Normal stress test. Although the stress test is normal, patient is still considered to be an elevated risk for any perioperative cardiac complications due to her advanced age.  No prior cardiac testing is indicated prior to proceeding with the planned procedure."  Anticipate pt can proceed with planned procedure barring acute status change.   VS: BP 134/76   Pulse 79   Temp 36.5 C (Oral)   Ht 5\' 1"  (1.549 m)   Wt 51.7 kg   SpO2 99%   BMI 21.54 kg/m   PROVIDERS: Carylon Perches, MD is PCP  Cardiologist - Dr. Luane School  LABS: Labs reviewed: Acceptable for surgery. (all labs ordered are listed, but only abnormal results are displayed)  Labs Reviewed   BASIC METABOLIC PANEL - Abnormal; Notable for the following components:      Result Value   Sodium 132 (*)    Chloride 96 (*)    Glucose, Bld 104 (*)    BUN 40 (*)    Creatinine, Ser 1.20 (*)    GFR, Estimated 42 (*)    All other components within normal limits  CBC     IMAGES:   EKG:   CV: Myocardial perfusion 03/14/2022   The study is normal. There are no perfusion defects. The study is low risk.   No ST deviation was noted.   LV perfusion is normal.   Left ventricular function is normal. Nuclear stress EF: 75 %. The left ventricular ejection fraction is hyperdynamic (>65%). End diastolic cavity size is normal.  Echo 03/10/2022 1. Left ventricular ejection fraction, by estimation, is 60 to 65%. The  left ventricle has normal function. The left ventricle has no regional  wall motion abnormalities. Left ventricular diastolic parameters are  consistent with Grade I diastolic  dysfunction (impaired relaxation). Elevated left atrial pressure. The  average left ventricular global longitudinal strain is -21.2 %. The global  longitudinal strain is normal.   2. Right ventricular systolic function is normal. The right ventricular  size is normal. There is normal pulmonary artery systolic pressure.   3. A small pericardial effusion is present. The pericardial effusion is  circumferential.   4. The mitral valve is normal in structure. No evidence of mitral valve  regurgitation. No evidence of mitral stenosis.   5. The tricuspid valve is abnormal. Tricuspid  valve regurgitation is  moderate.   6. The aortic valve is tricuspid. There is mild calcification of the  aortic valve. There is mild thickening of the aortic valve. Aortic valve  regurgitation is trivial. No aortic stenosis is present.   7. The inferior vena cava is normal in size with greater than 50%  respiratory variability, suggesting right atrial pressure of 3 mmHg.  Past Medical History:  Diagnosis Date   Arthritis     Glaucoma    HOH (hard of hearing)    HTN (hypertension)     Past Surgical History:  Procedure Laterality Date   APPENDECTOMY     COLONOSCOPY     COLONOSCOPY  03/17/2011   Procedure: COLONOSCOPY;  Surgeon: Malissa Hippo, MD;  Location: [facility];  Service: Endoscopy;  Laterality: N/A;  130   HIP SURGERY  x 2 hip replacements   JOINT REPLACEMENT  x 2 hips   TONSILLECTOMY     UPPER GASTROINTESTINAL ENDOSCOPY     WRIST FRACTURE SURGERY     left    MEDICATIONS:  hydrochlorothiazide 25 MG tablet   latanoprost (XALATAN) 0.005 % ophthalmic solution   lisinopril (ZESTRIL) 20 MG tablet   Multiple Vitamins-Calcium TABS   No current facility-administered medications for this encounter.     Jodell Cipro Ward, PA-C [facility] Pre-Surgical Testing [phone]

## 2022-06-15 ENCOUNTER — Encounter (HOSPITAL_COMMUNITY): Payer: Self-pay | Admitting: Surgery

## 2022-06-15 ENCOUNTER — Ambulatory Visit (HOSPITAL_COMMUNITY): Payer: Medicare Other | Admitting: Physician Assistant

## 2022-06-15 ENCOUNTER — Encounter (HOSPITAL_COMMUNITY): Admission: RE | Disposition: E | Payer: Self-pay | Source: Home / Self Care | Attending: Surgery

## 2022-06-15 ENCOUNTER — Other Ambulatory Visit: Payer: Self-pay

## 2022-06-15 ENCOUNTER — Ambulatory Visit (HOSPITAL_COMMUNITY): Payer: Medicare Other | Admitting: Certified Registered Nurse Anesthetist

## 2022-06-15 ENCOUNTER — Inpatient Hospital Stay (HOSPITAL_COMMUNITY)
Admission: RE | Admit: 2022-06-15 | Discharge: 2022-07-18 | DRG: 328 | Disposition: E | Payer: Medicare Other | Attending: Surgery | Admitting: Surgery

## 2022-06-15 DIAGNOSIS — I10 Essential (primary) hypertension: Secondary | ICD-10-CM | POA: Diagnosis not present

## 2022-06-15 DIAGNOSIS — Z79899 Other long term (current) drug therapy: Secondary | ICD-10-CM | POA: Diagnosis not present

## 2022-06-15 DIAGNOSIS — I361 Nonrheumatic tricuspid (valve) insufficiency: Secondary | ICD-10-CM | POA: Diagnosis not present

## 2022-06-15 DIAGNOSIS — I129 Hypertensive chronic kidney disease with stage 1 through stage 4 chronic kidney disease, or unspecified chronic kidney disease: Secondary | ICD-10-CM | POA: Diagnosis present

## 2022-06-15 DIAGNOSIS — N182 Chronic kidney disease, stage 2 (mild): Secondary | ICD-10-CM | POA: Diagnosis present

## 2022-06-15 DIAGNOSIS — K449 Diaphragmatic hernia without obstruction or gangrene: Principal | ICD-10-CM

## 2022-06-15 DIAGNOSIS — H9193 Unspecified hearing loss, bilateral: Secondary | ICD-10-CM | POA: Diagnosis present

## 2022-06-15 DIAGNOSIS — K7689 Other specified diseases of liver: Secondary | ICD-10-CM | POA: Diagnosis present

## 2022-06-15 DIAGNOSIS — R079 Chest pain, unspecified: Secondary | ICD-10-CM | POA: Diagnosis present

## 2022-06-15 DIAGNOSIS — M1711 Unilateral primary osteoarthritis, right knee: Secondary | ICD-10-CM | POA: Diagnosis present

## 2022-06-15 DIAGNOSIS — K562 Volvulus: Secondary | ICD-10-CM | POA: Diagnosis not present

## 2022-06-15 DIAGNOSIS — H3530 Unspecified macular degeneration: Secondary | ICD-10-CM | POA: Diagnosis present

## 2022-06-15 DIAGNOSIS — K440 Diaphragmatic hernia with obstruction, without gangrene: Secondary | ICD-10-CM | POA: Diagnosis not present

## 2022-06-15 DIAGNOSIS — R1310 Dysphagia, unspecified: Secondary | ICD-10-CM | POA: Diagnosis present

## 2022-06-15 DIAGNOSIS — Z6821 Body mass index (BMI) 21.0-21.9, adult: Secondary | ICD-10-CM | POA: Diagnosis not present

## 2022-06-15 DIAGNOSIS — Z885 Allergy status to narcotic agent status: Secondary | ICD-10-CM

## 2022-06-15 DIAGNOSIS — E860 Dehydration: Secondary | ICD-10-CM | POA: Diagnosis present

## 2022-06-15 DIAGNOSIS — F419 Anxiety disorder, unspecified: Secondary | ICD-10-CM | POA: Diagnosis present

## 2022-06-15 DIAGNOSIS — R634 Abnormal weight loss: Secondary | ICD-10-CM | POA: Diagnosis present

## 2022-06-15 DIAGNOSIS — H409 Unspecified glaucoma: Secondary | ICD-10-CM | POA: Diagnosis present

## 2022-06-15 DIAGNOSIS — Z96643 Presence of artificial hip joint, bilateral: Secondary | ICD-10-CM | POA: Diagnosis present

## 2022-06-15 HISTORY — PX: XI ROBOTIC ASSISTED PARAESOPHAGEAL HERNIA REPAIR: SHX6871

## 2022-06-15 LAB — MRSA NEXT GEN BY PCR, NASAL: MRSA by PCR Next Gen: NOT DETECTED

## 2022-06-15 SURGERY — REPAIR, HERNIA, PARAESOPHAGEAL, ROBOT-ASSISTED
Anesthesia: General

## 2022-06-15 MED ORDER — ORAL CARE MOUTH RINSE: 15.0000 mL | OROMUCOSAL | Status: DC | PRN

## 2022-06-15 MED ORDER — PHENYLEPHRINE 80 MCG/ML (10ML) SYRINGE FOR IV PUSH (FOR BLOOD PRESSURE SUPPORT)
PREFILLED_SYRINGE | INTRAVENOUS | Status: AC
  Filled 2022-06-15: qty 10

## 2022-06-15 MED ORDER — CHLORHEXIDINE GLUCONATE 0.12 % MT SOLN
15.0000 mL | Freq: Once | OROMUCOSAL | Status: AC
  Administered 2022-06-15: 15 mL via OROMUCOSAL

## 2022-06-15 MED ORDER — DEXAMETHASONE SODIUM PHOSPHATE 4 MG/ML IJ SOLN
4.0000 mg | INTRAMUSCULAR | Status: AC
  Administered 2022-06-15: 4 mg via INTRAVENOUS

## 2022-06-15 MED ORDER — HALOPERIDOL LACTATE 5 MG/ML IJ SOLN: 2.0000 mg | Freq: Four times a day (QID) | INTRAMUSCULAR | Status: AC | PRN

## 2022-06-15 MED ORDER — MENTHOL 3 MG MT LOZG: 1.0000 | LOZENGE | OROMUCOSAL | Status: DC | PRN

## 2022-06-15 MED ORDER — ONDANSETRON HCL 4 MG/2ML IJ SOLN
4.0000 mg | Freq: Four times a day (QID) | INTRAMUSCULAR | Status: DC | PRN
  Administered 2022-06-16: 4 mg via INTRAVENOUS
  Filled 2022-06-15: qty 2

## 2022-06-15 MED ORDER — ALUM & MAG HYDROXIDE-SIMETH 200-200-20 MG/5ML PO SUSP: 30.0000 mL | Freq: Four times a day (QID) | ORAL | Status: DC | PRN

## 2022-06-15 MED ORDER — OXYCODONE HCL 5 MG PO TABS
2.5000 mg | ORAL_TABLET | ORAL | Status: DC | PRN
  Administered 2022-06-15: 2.5 mg via ORAL
  Filled 2022-06-15: qty 1

## 2022-06-15 MED ORDER — LACTATED RINGERS IV SOLN: INTRAVENOUS | Status: DC

## 2022-06-15 MED ORDER — ALBUMIN HUMAN 5 % IV SOLN
12.5000 g | Freq: Four times a day (QID) | INTRAVENOUS | Status: DC | PRN
  Administered 2022-06-16: 12.5 g via INTRAVENOUS
  Filled 2022-06-15: qty 250

## 2022-06-15 MED ORDER — ENSURE PRE-SURGERY PO LIQD: 296.0000 mL | Freq: Once | ORAL | Status: DC

## 2022-06-15 MED ORDER — LACTATED RINGERS IR SOLN
Status: DC | PRN
  Administered 2022-06-15: 1000 mL

## 2022-06-15 MED ORDER — ONDANSETRON HCL 4 MG/2ML IJ SOLN
INTRAMUSCULAR | Status: DC | PRN
  Administered 2022-06-15: 4 mg via INTRAVENOUS

## 2022-06-15 MED ORDER — SODIUM CHLORIDE 0.9 % IV SOLN: 250.0000 mL | INTRAVENOUS | Status: DC | PRN

## 2022-06-15 MED ORDER — BUPIVACAINE LIPOSOME 1.3 % IJ SUSP
INTRAMUSCULAR | Status: DC | PRN
  Administered 2022-06-15: 50 mL

## 2022-06-15 MED ORDER — CHLORHEXIDINE GLUCONATE CLOTH 2 % EX PADS: 6.0000 | MEDICATED_PAD | Freq: Once | CUTANEOUS | Status: DC

## 2022-06-15 MED ORDER — HYDROMORPHONE HCL 1 MG/ML IJ SOLN
0.5000 mg | INTRAMUSCULAR | Status: DC | PRN
  Administered 2022-06-15: 1 mg via INTRAVENOUS
  Filled 2022-06-15: qty 2

## 2022-06-15 MED ORDER — SUCCINYLCHOLINE CHLORIDE 200 MG/10ML IV SOSY
PREFILLED_SYRINGE | INTRAVENOUS | Status: DC | PRN
  Administered 2022-06-15: 60 mg via INTRAVENOUS

## 2022-06-15 MED ORDER — ROCURONIUM BROMIDE 10 MG/ML (PF) SYRINGE
PREFILLED_SYRINGE | INTRAVENOUS | Status: AC
  Filled 2022-06-15: qty 10

## 2022-06-15 MED ORDER — BUPIVACAINE LIPOSOME 1.3 % IJ SUSP: 20.0000 mL | Freq: Once | INTRAMUSCULAR | Status: DC

## 2022-06-15 MED ORDER — GABAPENTIN 300 MG PO CAPS
300.0000 mg | ORAL_CAPSULE | ORAL | Status: AC
  Administered 2022-06-15: 300 mg via ORAL
  Filled 2022-06-15: qty 1

## 2022-06-15 MED ORDER — ROCURONIUM BROMIDE 10 MG/ML (PF) SYRINGE
PREFILLED_SYRINGE | INTRAVENOUS | Status: DC | PRN
  Administered 2022-06-15: 60 mg via INTRAVENOUS

## 2022-06-15 MED ORDER — BISACODYL 10 MG RE SUPP: 10.0000 mg | Freq: Every day | RECTAL | Status: DC | PRN

## 2022-06-15 MED ORDER — PROCHLORPERAZINE EDISYLATE 10 MG/2ML IJ SOLN: 5.0000 mg | Freq: Four times a day (QID) | INTRAMUSCULAR | Status: DC | PRN

## 2022-06-15 MED ORDER — CHLORHEXIDINE GLUCONATE CLOTH 2 % EX PADS
6.0000 | MEDICATED_PAD | Freq: Every day | CUTANEOUS | Status: DC
  Administered 2022-06-15 – 2022-06-17 (×3): 6 via TOPICAL

## 2022-06-15 MED ORDER — LATANOPROST 0.005 % OP SOLN
1.0000 [drp] | Freq: Every day | OPHTHALMIC | Status: DC
  Administered 2022-06-15 – 2022-06-17 (×3): 1 [drp] via OPHTHALMIC
  Filled 2022-06-15 (×2): qty 2.5

## 2022-06-15 MED ORDER — 0.9 % SODIUM CHLORIDE (POUR BTL) OPTIME
TOPICAL | Status: DC | PRN
  Administered 2022-06-15: 1000 mL

## 2022-06-15 MED ORDER — BUPIVACAINE LIPOSOME 1.3 % IJ SUSP: INTRAMUSCULAR | Status: DC | PRN

## 2022-06-15 MED ORDER — FENTANYL CITRATE (PF) 100 MCG/2ML IJ SOLN
INTRAMUSCULAR | Status: AC
  Filled 2022-06-15: qty 2

## 2022-06-15 MED ORDER — GABAPENTIN 100 MG PO CAPS
300.0000 mg | ORAL_CAPSULE | Freq: Two times a day (BID) | ORAL | Status: DC
  Administered 2022-06-15 – 2022-06-17 (×4): 300 mg via ORAL
  Filled 2022-06-15: qty 1
  Filled 2022-06-15: qty 3
  Filled 2022-06-15: qty 1
  Filled 2022-06-15: qty 3

## 2022-06-15 MED ORDER — METOCLOPRAMIDE HCL 5 MG/ML IJ SOLN: 5.0000 mg | Freq: Three times a day (TID) | INTRAMUSCULAR | Status: DC | PRN

## 2022-06-15 MED ORDER — LIP MEDEX EX OINT
TOPICAL_OINTMENT | Freq: Two times a day (BID) | CUTANEOUS | Status: DC
  Administered 2022-06-15: 1 via TOPICAL
  Administered 2022-06-16 – 2022-06-17 (×2): 75 via TOPICAL
  Filled 2022-06-15 (×3): qty 7

## 2022-06-15 MED ORDER — ACETAMINOPHEN 500 MG PO TABS
1000.0000 mg | ORAL_TABLET | ORAL | Status: AC
  Administered 2022-06-15: 1000 mg via ORAL
  Filled 2022-06-15: qty 2

## 2022-06-15 MED ORDER — SODIUM CHLORIDE 0.9 % IV SOLN
2.0000 g | INTRAVENOUS | Status: AC
  Administered 2022-06-15: 2 g via INTRAVENOUS
  Filled 2022-06-15: qty 20

## 2022-06-15 MED ORDER — SODIUM CHLORIDE 0.9% FLUSH: 3.0000 mL | INTRAVENOUS | Status: DC | PRN

## 2022-06-15 MED ORDER — ENOXAPARIN SODIUM 30 MG/0.3ML IJ SOSY
30.0000 mg | PREFILLED_SYRINGE | INTRAMUSCULAR | Status: DC
  Administered 2022-06-16: 30 mg via SUBCUTANEOUS
  Filled 2022-06-15: qty 0.3

## 2022-06-15 MED ORDER — ONDANSETRON 4 MG PO TBDP: 4.0000 mg | ORAL_TABLET | Freq: Four times a day (QID) | ORAL | Status: DC | PRN

## 2022-06-15 MED ORDER — ENSURE PRE-SURGERY PO LIQD: 592.0000 mL | Freq: Once | ORAL | Status: DC

## 2022-06-15 MED ORDER — LIDOCAINE 2% (20 MG/ML) 5 ML SYRINGE
INTRAMUSCULAR | Status: DC | PRN
  Administered 2022-06-15: 40 mg via INTRAVENOUS

## 2022-06-15 MED ORDER — METOPROLOL TARTRATE 5 MG/5ML IV SOLN: 5.0000 mg | Freq: Four times a day (QID) | INTRAVENOUS | Status: DC | PRN

## 2022-06-15 MED ORDER — MAGIC MOUTHWASH: 15.0000 mL | Freq: Four times a day (QID) | ORAL | Status: DC | PRN

## 2022-06-15 MED ORDER — SODIUM CHLORIDE 0.9% FLUSH
3.0000 mL | Freq: Two times a day (BID) | INTRAVENOUS | Status: DC
  Administered 2022-06-15 – 2022-06-17 (×5): 3 mL via INTRAVENOUS

## 2022-06-15 MED ORDER — MAGNESIUM HYDROXIDE 400 MG/5ML PO SUSP
30.0000 mL | Freq: Every day | ORAL | Status: DC | PRN
  Administered 2022-06-17: 30 mL via ORAL
  Filled 2022-06-15: qty 30

## 2022-06-15 MED ORDER — BUPIVACAINE HCL 0.25 % IJ SOLN
INTRAMUSCULAR | Status: AC
  Filled 2022-06-15: qty 1

## 2022-06-15 MED ORDER — DIPHENHYDRAMINE HCL 50 MG/ML IJ SOLN: 12.5000 mg | Freq: Four times a day (QID) | INTRAMUSCULAR | Status: DC | PRN

## 2022-06-15 MED ORDER — PROCHLORPERAZINE MALEATE 10 MG PO TABS: 10.0000 mg | ORAL_TABLET | Freq: Four times a day (QID) | ORAL | Status: DC | PRN

## 2022-06-15 MED ORDER — PROPOFOL 10 MG/ML IV BOLUS
INTRAVENOUS | Status: DC | PRN
  Administered 2022-06-15: 50 mg via INTRAVENOUS

## 2022-06-15 MED ORDER — ONDANSETRON HCL 4 MG/2ML IJ SOLN
INTRAMUSCULAR | Status: AC
  Filled 2022-06-15: qty 2

## 2022-06-15 MED ORDER — ACETAMINOPHEN 500 MG PO TABS
1000.0000 mg | ORAL_TABLET | Freq: Four times a day (QID) | ORAL | Status: DC
  Administered 2022-06-15 – 2022-06-18 (×7): 1000 mg via ORAL
  Filled 2022-06-15 (×9): qty 2

## 2022-06-15 MED ORDER — ORAL CARE MOUTH RINSE
15.0000 mL | OROMUCOSAL | Status: DC
  Administered 2022-06-15 – 2022-06-17 (×9): 15 mL via OROMUCOSAL

## 2022-06-15 MED ORDER — METHOCARBAMOL 500 MG PO TABS: 500.0000 mg | ORAL_TABLET | Freq: Four times a day (QID) | ORAL | Status: DC | PRN

## 2022-06-15 MED ORDER — DIPHENHYDRAMINE HCL 12.5 MG/5ML PO ELIX: 12.5000 mg | ORAL_SOLUTION | Freq: Four times a day (QID) | ORAL | Status: DC | PRN

## 2022-06-15 MED ORDER — SIMETHICONE 40 MG/0.6ML PO SUSP: 80.0000 mg | Freq: Four times a day (QID) | ORAL | Status: DC | PRN

## 2022-06-15 MED ORDER — PHENOL 1.4 % MT LIQD: 2.0000 | OROMUCOSAL | Status: DC | PRN

## 2022-06-15 MED ORDER — ENALAPRILAT 1.25 MG/ML IV SOLN: 0.6250 mg | Freq: Four times a day (QID) | INTRAVENOUS | Status: DC | PRN

## 2022-06-15 MED ORDER — ORAL CARE MOUTH RINSE: 15.0000 mL | Freq: Once | OROMUCOSAL | Status: AC

## 2022-06-15 MED ORDER — PHENYLEPHRINE HCL-NACL 20-0.9 MG/250ML-% IV SOLN
INTRAVENOUS | Status: DC | PRN
  Administered 2022-06-15: 30 ug/min via INTRAVENOUS

## 2022-06-15 MED ORDER — PHENYLEPHRINE 80 MCG/ML (10ML) SYRINGE FOR IV PUSH (FOR BLOOD PRESSURE SUPPORT)
PREFILLED_SYRINGE | INTRAVENOUS | Status: DC | PRN
  Administered 2022-06-15 (×2): 160 ug via INTRAVENOUS
  Administered 2022-06-15: 80 ug via INTRAVENOUS
  Administered 2022-06-15: 160 ug via INTRAVENOUS
  Administered 2022-06-15: 120 ug via INTRAVENOUS
  Administered 2022-06-15: 80 ug via INTRAVENOUS
  Administered 2022-06-15: 120 ug via INTRAVENOUS
  Administered 2022-06-15: 80 ug via INTRAVENOUS

## 2022-06-15 MED ORDER — METOPROLOL TARTRATE 5 MG/5ML IV SOLN
2.5000 mg | Freq: Four times a day (QID) | INTRAVENOUS | Status: DC
  Administered 2022-06-15 (×2): 2.5 mg via INTRAVENOUS
  Filled 2022-06-15 (×2): qty 5

## 2022-06-15 MED ORDER — DEXAMETHASONE SODIUM PHOSPHATE 10 MG/ML IJ SOLN
INTRAMUSCULAR | Status: AC
  Filled 2022-06-15: qty 1

## 2022-06-15 MED ORDER — SUCCINYLCHOLINE CHLORIDE 200 MG/10ML IV SOSY
PREFILLED_SYRINGE | INTRAVENOUS | Status: AC
  Filled 2022-06-15: qty 10

## 2022-06-15 MED ORDER — FENTANYL CITRATE (PF) 100 MCG/2ML IJ SOLN
INTRAMUSCULAR | Status: DC | PRN
  Administered 2022-06-15: 100 ug via INTRAVENOUS

## 2022-06-15 MED ORDER — BUPIVACAINE LIPOSOME 1.3 % IJ SUSP
INTRAMUSCULAR | Status: AC
  Filled 2022-06-15: qty 20

## 2022-06-15 MED ORDER — METHOCARBAMOL 1000 MG/10ML IJ SOLN: 1000.0000 mg | Freq: Four times a day (QID) | INTRAVENOUS | Status: DC | PRN

## 2022-06-15 MED ORDER — SUGAMMADEX SODIUM 200 MG/2ML IV SOLN
INTRAVENOUS | Status: DC | PRN
  Administered 2022-06-15: 200 mg via INTRAVENOUS

## 2022-06-15 SURGICAL SUPPLY — 72 items
APL PRP STRL LF DISP 70% ISPRP (MISCELLANEOUS) ×1
APL SWBSTK 6 STRL LF DISP (MISCELLANEOUS)
APPLICATOR COTTON TIP 6 STRL (MISCELLANEOUS) ×1 IMPLANT
APPLICATOR COTTON TIP 6IN STRL (MISCELLANEOUS)
APPLIER CLIP 5 13 M/L LIGAMAX5 (MISCELLANEOUS)
APR CLP MED LRG 5 ANG JAW (MISCELLANEOUS)
BAG COUNTER SPONGE SURGICOUNT (BAG) ×1 IMPLANT
BAG SPNG CNTER NS LX DISP (BAG) ×1
BLADE SURG SZ11 CARB STEEL (BLADE) ×1 IMPLANT
CHLORAPREP W/TINT 26 (MISCELLANEOUS) ×1 IMPLANT
CLIP APPLIE 5 13 M/L LIGAMAX5 (MISCELLANEOUS) IMPLANT
COVER SURGICAL LIGHT HANDLE (MISCELLANEOUS) ×1 IMPLANT
COVER TIP SHEARS 8 DVNC (MISCELLANEOUS) IMPLANT
DRAIN CHANNEL 19F RND (DRAIN) IMPLANT
DRAIN PENROSE 0.5X18 (DRAIN) IMPLANT
DRAPE ARM DVNC X/XI (DISPOSABLE) ×4 IMPLANT
DRAPE COLUMN DVNC XI (DISPOSABLE) ×1 IMPLANT
DRAPE WARM FLUID 44X44 (DRAPES) ×1 IMPLANT
DRIVER NDL LRG 8 DVNC XI (INSTRUMENTS) ×2 IMPLANT
DRIVER NDL MEGA SUTCUT DVNCXI (INSTRUMENTS) ×1 IMPLANT
DRIVER NDLE LRG 8 DVNC XI (INSTRUMENTS) ×2 IMPLANT
DRIVER NDLE MEGA SUTCUT DVNCXI (INSTRUMENTS) ×1 IMPLANT
DRSG TEGADERM 2-3/8X2-3/4 SM (GAUZE/BANDAGES/DRESSINGS) ×5 IMPLANT
ELECT REM PT RETURN 15FT ADLT (MISCELLANEOUS) ×1 IMPLANT
ENDOLOOP SUT PDS II  0 18 (SUTURE)
ENDOLOOP SUT PDS II 0 18 (SUTURE) IMPLANT
EVACUATOR DRAINAGE 10X20 100CC (DRAIN) IMPLANT
EVACUATOR SILICONE 100CC (DRAIN) IMPLANT
FELT TEFLON 4 X1 (Mesh General) ×1 IMPLANT
FORCEPS PROGRASP DVNC XI (FORCEP) ×1 IMPLANT
GAUZE SPONGE 2X2 8PLY STRL LF (GAUZE/BANDAGES/DRESSINGS) ×1 IMPLANT
GLOVE ECLIPSE 8.0 STRL XLNG CF (GLOVE) ×2 IMPLANT
GLOVE INDICATOR 8.0 STRL GRN (GLOVE) ×2 IMPLANT
GOWN STRL REUS W/ TWL XL LVL3 (GOWN DISPOSABLE) ×4 IMPLANT
GOWN STRL REUS W/TWL XL LVL3 (GOWN DISPOSABLE) ×4
GRASPER SUT TROCAR 14GX15 (MISCELLANEOUS) IMPLANT
GRASPER TIP-UP FEN DVNC XI (INSTRUMENTS) ×1 IMPLANT
IRRIG SUCT STRYKERFLOW 2 WTIP (MISCELLANEOUS) ×1
IRRIGATION SUCT STRKRFLW 2 WTP (MISCELLANEOUS) ×1 IMPLANT
KIT BASIN OR (CUSTOM PROCEDURE TRAY) ×1 IMPLANT
KIT TURNOVER KIT A (KITS) IMPLANT
MESH PHASIX RESORB RECT 10X15 (Mesh General) IMPLANT
NDL HYPO 22X1.5 SAFETY MO (MISCELLANEOUS) ×1 IMPLANT
NEEDLE HYPO 22X1.5 SAFETY MO (MISCELLANEOUS) ×1 IMPLANT
PACK CARDIOVASCULAR III (CUSTOM PROCEDURE TRAY) ×1 IMPLANT
PAD POSITIONING PINK XL (MISCELLANEOUS) ×1 IMPLANT
PENCIL SMOKE EVACUATOR (MISCELLANEOUS) IMPLANT
SCISSORS LAP 5X45 EPIX DISP (ENDOMECHANICALS) IMPLANT
SCISSORS MNPLR CVD DVNC XI (INSTRUMENTS) ×1 IMPLANT
SEAL UNIV 5-12 XI (MISCELLANEOUS) ×3 IMPLANT
SEALER VESSEL EXT DVNC XI (MISCELLANEOUS) ×1 IMPLANT
SOL ELECTROSURG ANTI STICK (MISCELLANEOUS) ×1
SOLUTION ELECTROSURG ANTI STCK (MISCELLANEOUS) ×1 IMPLANT
SPIKE FLUID TRANSFER (MISCELLANEOUS) ×1 IMPLANT
STOPCOCK 4 WAY LG BORE MALE ST (IV SETS) ×2 IMPLANT
SUT ETHIBOND 0 36 GRN (SUTURE) ×2 IMPLANT
SUT ETHIBOND NAB CT1 #1 30IN (SUTURE) ×3 IMPLANT
SUT MNCRL AB 4-0 PS2 18 (SUTURE) ×1 IMPLANT
SUT PROLENE 2 0 SH DA (SUTURE) IMPLANT
SUT V-LOC BARB 180 2/0GR6 GS22 (SUTURE) ×1
SUT VICRYL 0 TIES 12 18 (SUTURE) IMPLANT
SUT VICRYL 0 UR6 27IN ABS (SUTURE) IMPLANT
SUT VLOC 180 2-0 9IN GS21 (SUTURE) IMPLANT
SUTURE V-LC BRB 180 2/0GR6GS22 (SUTURE) IMPLANT
SYR 10ML LL (SYRINGE) ×1 IMPLANT
SYR 20ML LL LF (SYRINGE) ×1 IMPLANT
TOWEL OR 17X26 10 PK STRL BLUE (TOWEL DISPOSABLE) ×1 IMPLANT
TOWEL OR NON WOVEN STRL DISP B (DISPOSABLE) ×1 IMPLANT
TRAY FOLEY MTR SLVR 14FR STAT (SET/KITS/TRAYS/PACK) IMPLANT
TRAY FOLEY MTR SLVR 16FR STAT (SET/KITS/TRAYS/PACK) IMPLANT
TROCAR ADV FIXATION 5X100MM (TROCAR) ×1 IMPLANT
TUBING INSUFFLATION 10FT LAP (TUBING) ×1 IMPLANT

## 2022-06-15 NOTE — Transfer of Care (Signed)
Immediate Anesthesia Transfer of Care Note  Patient: Kristen Marshall  Procedure(s) Performed: XI ROBOTIC ASSISTED PARAESOPHAGEAL HIATAL HERNIA REPAIR WITH  MESH AND FUNDOPLICATION  Patient Location: PACU  Anesthesia Type:General  Level of Consciousness: sedated  Airway & Oxygen Therapy: Patient Spontanous Breathing and Patient connected to face mask oxygen  Post-op Assessment: Report given to RN and Post -op Vital signs reviewed and stable  Post vital signs: Reviewed and stable  Last Vitals:  Vitals Value Taken Time  BP 115/52 06/15/22 1530  Temp    Pulse 25 06/15/22 1532  Resp 17 06/15/22 1533  SpO2 91 % 06/15/22 1532  Vitals shown include unvalidated device data.  Last Pain:  Vitals:   06/15/22 1019  TempSrc: Oral  PainSc:       Patients Stated Pain Goal: 4 (06/15/22 1015)  Complications: No notable events documented.

## 2022-06-15 NOTE — Anesthesia Procedure Notes (Addendum)
Procedure Name: Intubation Date/Time: 06/15/2022 12:37 PM  Performed by: Vanessa [place], CRNAPre-anesthesia Checklist: Patient identified, Emergency Drugs available, Suction available and Patient being monitored Patient Re-evaluated:Patient Re-evaluated prior to induction Oxygen Delivery Method: Circle system utilized Preoxygenation: Pre-oxygenation with 100% oxygen Induction Type: IV induction, Rapid sequence and Cricoid Pressure applied Laryngoscope Size: 2 and Miller Grade View: Grade I Tube type: Oral Tube size: 6.5 mm Number of attempts: 1 Airway Equipment and Method: Stylet Placement Confirmation: ETT inserted through vocal cords under direct vision, positive ETCO2 and breath sounds checked- equal and bilateral Secured at: 20 cm Tube secured with: Tape Dental Injury: Teeth and Oropharynx as per pre-operative assessment

## 2022-06-15 NOTE — Progress Notes (Signed)
Patient arrived to stepdown, subcutaneous air noted in upper chest and neck, paged on-call provider at 4:58pm, unable to reach Dr. Michaell Cowing at this time. Per Dr. Cliffton Asters, Dr. Michaell Cowing aware of subcutaneous air. Patient also lethargic/drowsy, MD made aware. Continue current treatment plan.

## 2022-06-15 NOTE — Op Note (Signed)
06/15/2022  3:29 PM  PATIENT:  Kristen Marshall  89 y.o. female  Patient Care Team: Carylon Perches, MD as PCP - General (Internal Medicine) Mallipeddi, Orion Modest, MD as PCP - Cardiology (Cardiology) Karie Soda, MD as Consulting Physician (General Surgery) Rehman, Joline Maxcy, MD (Inactive) as Consulting Physician (Gastroenterology)  PRE-OPERATIVE DIAGNOSIS:  PARAESOPHAGEAL HIATAL HERNIA  POST-OPERATIVE DIAGNOSIS:  INCARCERATED PARAESOPHAGEAL HIATAL HERNIA WITH MESENTERO-AXIAL VOLVULUS  PROCEDURE:   1. ROBOTIC reduction of paraesophageal hiatal hernia 2. Type II mediastinal dissection. 3. Crural release - bilateral 4. Primary repair of hiatal hernia over pledgets.  5. Anterior & posterior gastropexy. 6. Toupet (270 degree partial posterior x 4cm)  fundoplication 7. Mesh reinforcement with absorbable mesh  SURGEON:  Ardeth Sportsman, MD  ASSISTANT:  Angelena Form, MD  An experienced assistant was required given the standard of surgical care given the complexity of the case.  This assistant was needed for exposure, dissection, suction, tissue approximation, retraction, perception, etc  ANESTHESIA:  General endotracheal intubation anesthesia (GETA) and Local & regional field block at incision(s) for perioperative & postoperative pain control provided with 30mL of bupivicaine 0.25%  Estimated Blood Loss (EBL):   Total I/O In: 1100 [I.V.:1000; IV Piggyback:100] Out: - .   (See anesthesia record)  Delay start of Pharmacological VTE agent (>24hrs) due to concerns of significant anemia, surgical blood loss, or risk of bleeding?:  no  DRAINS: (None) and 19 Fr Blake drain with tip resting in the mediastinum  SPECIMEN:  Hernia sac (not sent)  DISPOSITION OF SPECIMEN:  Pathology and (not applicable)  COUNTS:  Sponge, needle, & instrument counts CORRECT  PLAN OF CARE: Admit to inpatient   PATIENT DISPOSITION:  PACU - hemodynamically stable.  INDICATION:   Patient with symptomatic  paraesophageal hiatal hernia.  Very advanced age but good performance status and cleared by medicine and cardiology.  All of her stomach and most of her transverse colon incarcerated within it with worsening p.o. intake and chest pain.  Workup negative for any cardiac or pulmonary etiology.  No relief with antiacid medication.  After extensive discussion with patient and her family I offered hiatal hernia repair:  The anatomy & physiology of the foregut and anti-reflux mechanism was discussed.  The pathophysiology of hiatal herniation and GERD was discussed.  Natural history risks without surgery was discussed.   The patient's symptoms are not adequately controlled by medicines and other non-operative treatments.  I feel the risks of no intervention will lead to serious problems that outweigh the operative risks; therefore, I recommended surgery to reduce the hiatal hernia out of the chest and fundoplication to rebuild the anti-reflux valve and control reflux better.  Need for a thorough workup to rule out the differential diagnosis and plan treatment was explained.  I explained laparoscopic techniques with possible need for an open approach.  Risks such as bleeding, infection, abscess, leak, need for further treatment, heart attack, death, and other risks were discussed.   I noted a good likelihood this will help address the problem.  Goals of post-operative recovery were discussed as well.  Possibility that this will not correct all symptoms was explained.  Post-operative dysphagia, need for short-term liquid & pureed diet, inability to vomit, possibility of reherniation, possible need for medicines to help control symptoms in addition to surgery were discussed.  We will work to minimize complications.   Educational handouts further explaining the pathology, treatment options, and dysphagia diet was given as well.  Questions were answered.  The  patient expresses understanding & wishes to proceed with  surgery.  OR FINDINGS:   Giant paraesophageal hiatal hernia with all of the stomach and over half of the transverse colon and omentum incarcerated in the mediastinum.  There was a 15 x 12 cm hiatal defect.  Required bilateral crural/diaphragmatic releases.  It is a primary repair over pledgets.  Mesh reinforcement was used with PhasixT Mesh: a knitted monofilament mesh scaffold using Poly-4-hydroxybutyrate (P4HB), a biologically derived, fully resorbable material  The patient has a Toupet (270 degree partial posterior x 4cm)  fundoplication.  The patient has had anterior and posterior gastropexy.  DESCRIPTION:   Informed consent was confirmed.  The patient received IV antibiotics prior to incision.  The underwent general anesthesia without difficulty.  A Foley catheter sterilely placed.  The patient was positioned in split leg with arms tucked. The abdomen was prepped and draped in the sterile fashion.  Surgical time-out confirmed our plan.  I placed a 5 mm port in the left subcostal region using Varess entry technique with the patient in steep reverse Trendelenburg and left side up.  Entry was clean.  We induced carbon dioxide insufflation.  Camera inspection revealed no injury.  Under direct visualization, I placed extra ports.  I also placed a 5 mm port in the left subxiphoid region under direct visualization.  I removed that and placed an Omega-shaped rigid Nathanson liver retractor to lift the left lateral sector of the liver anteriorly to expose the esophageal hiatus.  She had a very large stretched out liver but we are able to reflect it out of the way.  Left her large known benign cyst alone as well.  This was secured to the bed using the iron man system.  The Xi robot was carefully docked and instruments placed and advanced under direct visualization.  We focused on dissection.  We grasped the anterior mediastinal sac at the apex of the crus.  I scored through that and got into the anterior  mediastinum.  I was able to free the mediastinal sac from its attachments to the pericardium and bilateral pleura using primarily focused gentle blunt dissection as well as focused vessel sealer dissection.  I transected phrenoesophageal attachments to the inner right crus, preserving a two centimeter cuff of mediastinal sac until I found the base of the crura.  I then came around anteriorly on the left side and freed up the phrenoesophageal attachments of the mediastinal sac on the medial part of the left crus on the superior half.  I did careful mediastinal dissection to free the mediastinal sac.  With that, we could relieve the suction cup affect of the hernia sac and help reduce initially the transverse colon and omentum and eventually most of the stomach back down into the abdomen, flipped back approriately.    We ligated the short gastrics along the lesser curvature of the stomach about a 1/4 the way down and then came up proximally over the fundus.  We released the attachments of the stomach to the retroperitoneum until we were able to connect with the prior dissection on the left crus.  We completed the release of phrenoesophageal attachments to the medial part of the left crus down to its base.  The spleen seemed partially auto infarcted with white phlegmon on top of it and adhesions to the left diaphragm.  I freed the spleen off its attachments to the diaphragm in that region along its primarily up her lateral lobe.  That helped get some  diaphragmatic release.      We placed the stomach and esophagus on axial tension.  I then did a Type II mediastinal dissection where I freed the esophagus from its attachments to the aorta, spine, pleura, and pericardium using primarily gentle blunt as well as focused ultrasonic dissection.  We saw the anterior & posterior vagus nerves intact.  We preserved it at all times.  I procedded to dissect about 25 cm proximally into the mediastinum.  With that I could  straighten out the esophagus and get 4-5 cm of intra-abdominal length of the esophagus at a best estimation.   I freed the anterior mediastinal sac off the esophagus & stomach.  We saw the anterior vagus nerve and freed the sac off of the vagus.  I dissected out & removed the fatty  epiphrenic pads at the esophagogastric junction. With that, I could better define the esophagogastric junction.  There is still quite a broad transverse defect.  I transected through the fascia of the diaphragm 4 cm lateral to the crus and 1 cm medial to the IVC in a vertical fashion.  That gave me 2-4 cm of release.  I focused on the left diaphragm and went through the fascia of the diaphragm layer by layer 6 cm lateral to the left crura in the sagittal plane.  Did get an area thinned out where actually got into the left pleura.. I ended up releasing both the left and right pleura to get some relaxation of the diaphragm and the crura.  Came through the posterior pleurae with a 15 cm opening to avoid any tension.  That I could get the car to come together with minimal tension.   I confirmed the the patient had 4-5 cm of intra-abdominal esophageal length off tension.  I brought the fundus of the stomach posterior to the esophagus over to the right side.  The wrap was mobile with the classic shoe shine maneuver.  Wrap became together gently.  We reflected the stomach left laterally and closed the esophageal hiatus using #1 Ethibond stitch using horizontal mattress stitches with pledgets on both sides.  I did that x2 stitches.  Then did a third horizontal mattress suture through the crura without a pledget.  Did 1 more simple interrupted as well.  That help bring the crura together with 1.5 cm anterior radial laxity around the esophagus.  The crura was somewhat thinned out and not great tissue but the left crura had good substance.  Closure came together well without any tension.    Because of the larger defect requiring bilateral  crural releases, I reinforced the repair using a 15x10 cm Phasix mesh.  I cut out a 2x4 cm part of mesh in the middle third of the mesh such that the mesh had a broad U shape transversely, one narrow tail 5 cm wide & the other broader, 8 cm wide.. We brought the mesh in and laid it over the crural repair, tails anterior over the crura.  I tucked the broader "U" tail of the mesh between the left diaphragm and the spleen, the narrower "U" tail over the right crus.  I secured a more narrow tail to the right crura carefully using 2 OV lock running suture, and taking care to have the crura covering up the medial corner of the Phasix mesh and not have the esophagus be potentially exposed to it.  I then focused to the left lateral and left superior sides of the broader "U" tail to  the left diaphragm band with 2-0 V lock running suture.   I brought the fundus of the stomach behind the esophagus and cardia to set up a fundoplication wrap.  I did a posterior gastropexy x3  by taking of 0 Ethibond interrupted stitches to the posterior part of the right side of the wrap and thru the mesh and crural closure.  I placed 0 Ethibond suture to the esophagus crura and upper part of the wrap on both sides in a mirror-image fashion to have bilateral anterior gastropexy's as well.  That way the stomach covered the mesh and protected it from any esophageal exposure.  With the anterior and posterior gastropexy's, stomach laid well for a fundoplication wrap.  I then did a classic 4cm Toupet fundoplication on the true esophagus above the cardia using 0 Ethibond stitch in the left superior side of the wrap, apex of the left inner crus then left anterior esophagus and tied that down to do a left anterior gastropexy.  Did a mirror-image stitch on the right side to do a right anterior gastropexy.  I then did 2 more distal pairs of suture between the inner part of the wrap and the anterolateral esophagus.  That way there were 3 total pairs of  fundoplication sutures offering a posterior 270 degree wrap.  Measured at 4 cm.  Classic Toupet fundoplication.    The wrap was soft and floppy.  1 cm radial defect.  Did not want to tie in this joint handle hernia in an elderly woman.  We placed a drain as noted above.  I did irrigation and ensured hemostasis.  I saw no evidence of any leak or perforation or other abnormality.  I removed the [facility] liver retractor under direct visualization.  I evacuated carbon dioxide and removed the ports.  The skin was closed with Monocryl and sterile dressings applied.  The patient is being extubated and brought back to the recovery room.  I discussed postop care in detail with the patient and family in in the office.  Discussed again with the patient and her son and daughter, Olegario Messier, in the holding area.  There were no hemodynamic or respiratory issues and would rather well.  However given her very advanced age I worry about postoperative atrial fibrillation and respiratory issues, so decided to place the patient in stepdown unit for close observation.  My partner that helped me with this case, Dr. Cliffton Asters, is on-call tonight to help follow.  I called x 2 & ended up leaving a voicemail to the patient's daughter, Caro Laroche .  Marland Kitchen  Ardeth Sportsman, M.D., F.A.C.S. Gastrointestinal and Minimally Invasive Surgery [facility] [address] [place], [place] [address] [phone] Main / Paging

## 2022-06-15 NOTE — Interval H&P Note (Signed)
History and Physical Interval Note:  06/15/2022 11:48 AM  Kristen Marshall  has presented today for surgery, with the diagnosis of PARAESOPHAGEAL HIATAL HERNIA.  The various methods of treatment have been discussed with the patient and family. After consideration of risks, benefits and other options for treatment, the patient has consented to  Procedure(s): XI ROBOTIC ASSISTED PARAESOPHAGEAL HIATAL HERNIA REPAIR WITH FUNDOPLICATION (N/A) as a surgical intervention.  The patient's history has been reviewed, patient examined, no change in status, stable for surgery.  I have reviewed the patient's chart and labs.  Questions were answered to the patient's satisfaction.    I have re-reviewed the the patient's records, history, medications, and allergies.  I have re-examined the patient.  I again discussed intraoperative plans and goals of post-operative recovery.  The patient agrees to proceed.  Kristen Marshall  1925/02/02 [id number]  Patient Care Team: Carylon Perches, MD as PCP - General (Internal Medicine) Marjo Bicker, MD as PCP - Cardiology (Cardiology) Karie Soda, MD as Consulting Physician (General Surgery) Malissa Hippo, MD (Inactive) as Consulting Physician (Gastroenterology)  Patient Active Problem List   Diagnosis Date Noted   Preop cardiovascular exam 03/02/2022   Swelling of lower extremity 03/02/2022   HTN (hypertension) 02/22/2011   Arthritis of knee, right 02/22/2011   Hematochezia 02/22/2011   Family history of carcinoma in situ of colon 02/22/2011    Past Medical History:  Diagnosis Date   Arthritis    Glaucoma    HOH (hard of hearing)    HTN (hypertension)     Past Surgical History:  Procedure Laterality Date   APPENDECTOMY     COLONOSCOPY     COLONOSCOPY  03/17/2011   Procedure: COLONOSCOPY;  Surgeon: Malissa Hippo, MD;  Location: [facility];  Service: Endoscopy;  Laterality: N/A;  130   HIP SURGERY  x 2 hip replacements   JOINT REPLACEMENT  x 2 hips    TONSILLECTOMY     UPPER GASTROINTESTINAL ENDOSCOPY     WRIST FRACTURE SURGERY     left    Social History   Socioeconomic History   Marital status: Widowed    Spouse name: Not on file   Number of children: Not on file   Years of education: college   Highest education level: Not on file  Occupational History   Occupation: retired    Associate Professor: RETIRED  Tobacco Use   Smoking status: Never   Smokeless tobacco: Never  Vaping Use   Vaping Use: Never used  Substance and Sexual Activity   Alcohol use: No   Drug use: No   Sexual activity: Yes    Birth control/protection: Post-menopausal  Other Topics Concern   Not on file  Social History Narrative   Not on file   Social Determinants of Health   Financial Resource Strain: Not on file  Food Insecurity: Not on file  Transportation Needs: Not on file  Physical Activity: Not on file  Stress: Not on file  Social Connections: Not on file  Intimate Partner Violence: Not on file    Family History  Problem Relation Age of Onset   Pneumonia Mother    Colon cancer Father    Healthy Daughter    Healthy Daughter    Healthy Son    Healthy Son    Anesthesia problems Neg Hx    Hypotension Neg Hx    Malignant hyperthermia Neg Hx    Pseudochol deficiency Neg Hx     Medications Prior  to Admission  Medication Sig Dispense Refill Last Dose   hydrochlorothiazide 25 MG tablet Take 25 mg by mouth daily.     06/14/2022   latanoprost (XALATAN) 0.005 % ophthalmic solution Place 1 drop into both eyes at bedtime.   Past Week   lisinopril (ZESTRIL) 20 MG tablet Take 20 mg by mouth daily.   06/14/2022   Multiple Vitamins-Calcium TABS Take 1 tablet by mouth daily.       Current Facility-Administered Medications  Medication Dose Route Frequency Provider Last Rate Last Admin   bupivacaine liposome (EXPAREL) 1.3 % injection 266 mg  20 mL Infiltration Once Karie Soda, MD       cefTRIAXone (ROCEPHIN) 2 g in sodium chloride 0.9 % 100 mL IVPB   2 g Intravenous On Call to OR Karie Soda, MD       Chlorhexidine Gluconate Cloth 2 % PADS 6 each  6 each Topical Once Karie Soda, MD       And   Chlorhexidine Gluconate Cloth 2 % PADS 6 each  6 each Topical Once Karie Soda, MD       dexamethasone (DECADRON) injection 4 mg  4 mg Intravenous On Call to OR Karie Soda, MD       lactated ringers infusion   Intravenous Continuous Gaynelle Adu, MD 10 mL/hr at 06/15/22 1026 Restarted at 06/15/22 1035     Allergies  Allergen Reactions   Codeine     BP (!) 156/84   Pulse 89   Temp 98.3 F (36.8 C) (Oral)   Resp 16   Ht 5\' 1"  (1.549 m)   Wt 51.7 kg   SpO2 96%   BMI 21.54 kg/m   Labs: No results found for this or any previous visit (from the past 48 hour(s)).  Imaging / Studies: No results found.   Ardeth Sportsman, M.D., F.A.C.S. Gastrointestinal and Minimally Invasive Surgery [facility] [address] [place], [place] [address] [phone] Main / Paging  06/15/2022 11:48 AM    Ardeth Sportsman

## 2022-06-15 NOTE — H&P (Signed)
06/15/2022   REFERRING PHYSICIAN: Beau Fanny, MD  Patient Care Team: Beau Fanny, MD as PCP - General (Internal Medicine) Michaell Cowing, Shawn Route, MD as Consulting Provider (General Surgery) Carollee Massed, MD (Gastroenterology)  PROVIDER: Jarrett Soho, MD  [facility] MRN: [id number] DOB: 07/04/1925  SUBJECTIVE  Chief Complaint: New Consultation and Hernia ( Eval of colonic paraesophageal hernia,)   Kristen Marshall is a 89 y.o. female who is seen today as an office consultation at the request of DrMarland Kitchen Ouida Sills for evaluation of giant hail hernia containing stomach and transverse colon.  History of Present Illness:  89 year old woman. Has been followed by Dr. Karilyn Cota gastroenterology up in [place].. Last colonoscopy a decade ago. She comes today with a caregiver. Nearest daughter lives in [place]. Patient notes she cannot sleep at night because she wakes up with sharp pain least a couple times at night for the past 3 months.. Has to take Tylenol. She has had decreasing tolerance of food especially solid food. Unintentional weight loss. Had a CAT scan which revealed a giant hiatal hernia taking most of her stomach in a knuckle of her mid transverse colon. Some hepatic cysts and question of gallstones. She is prescribed some hyosamine to see if that would help that it did not. Tums does not seem to affect things. Usually better during the day when she is up walking around but if she lies down it starts to bother her.. She still gets some persistent pain at night. While she did not want surgery, she wished to discuss with someone. Dr. Ouida Sills sent the patient to me. She recalls that there was discussion about going to gastrology but he figured to discuss with me first.  She walks with a cane for balance. Claims she can walk a mile without difficulty. No confusion or memory loss. She does not smoke. No cardiac or pulmonary issues. No blood thinners. No dysrhythmias. She tends to have some  constipation. Takes MiraLAX once a day. Will have intermittent constipation and diarrhea. She had an appendectomy but no other abdominal surgery.  Medical History:  Past Medical History: Diagnosis Date Anemia Aneurysm (CMS-HCC) Anxiety Arrhythmia Arthritis Glaucoma (increased eye pressure)  Patient Active Problem List Diagnosis Incarcerated hiatal hernia Chest pain Unintentional weight loss Dysphagia Bilateral hearing loss AMD (age related macular degeneration) Hepatic cyst  Past Surgical History: Procedure Laterality Date APPENDECTOMY JOINT REPLACEMENT   Allergies Allergen Reactions Codeine Nausea Tylenol-Codeine Solution Nausea  Current Outpatient Medications on File Prior to Visit Medication Sig Dispense Refill hyoscyamine (LEVSIN) 0.125 mg tablet hydroCHLOROthiazide (HYDRODIURIL) 25 MG tablet latanoprost (XALATAN) 0.005 % ophthalmic solution lisinopriL (ZESTRIL) 20 MG tablet  No current facility-administered medications on file prior to visit.  Family History Problem Relation Age of Onset Colon cancer Father   Social History  Tobacco Use Smoking Status Never Smokeless Tobacco Never   Social History  Socioeconomic History Marital status: Unknown Tobacco Use Smoking status: Never Smokeless tobacco: Never Substance and Sexual Activity Alcohol use: Not Currently Drug use: Never  ############################################################  Review of Systems: A complete review of systems (ROS) was obtained from the patient. We have reviewed this information and discussed as appropriate with the patient. See HPI as well for other pertinent ROS.  Constitutional: No fevers, chills, sweats. Weight stable Eyes: No vision changes, No discharge HENT: No sore throats, nasal drainage Lymph: No neck swelling, No bruising easily Pulmonary: No cough, productive sputum CV: No orthopnea, PND . No exertional chest/neck/shoulder/arm pain. Patient can walk  20  minutes gradually.  GI: No personal nor family history of GI/colon cancer, inflammatory bowel disease, irritable bowel syndrome, allergy such as Celiac Sprue, dietary/dairy problems, colitis, ulcers nor gastritis. No recent sick contacts/gastroenteritis. No travel outside the country. No changes in diet.  Renal: No UTIs, No hematuria Genital: No drainage, bleeding, masses Musculoskeletal: No severe joint pain. History of bilateral hip repairs. good ROM major joints Skin: No sores or lesions Heme/Lymph: No easy bleeding. No swollen lymph nodes Neuro: No active seizures. No facial droop Psych: No hallucinations. No agitation  OBJECTIVE  Vitals: 01/25/22 1021 BP: (!) 146/88 Pulse: 94 Temp: 36.1 C (97 F) SpO2: 96% Weight: 53.5 kg (118 lb) Height: 152.4 cm (5')  Body mass index is 23.05 kg/m.  PHYSICAL EXAM:  Constitutional: Not cachectic. Hygeine adequate. Vitals signs as above. Eyes: Poor vision,Pupils reactive, normal extraocular movements. Sclera nonicteric Neuro: CN II-XII intact. No major focal sensory defects. No major motor deficits. Lymph: No head/neck/groin lymphadenopathy Psych: No severe agitation. No severe anxiety. Judgment & insight Adequate, Oriented x4, HENT: Normocephalic, Mucus membranes moist. No thrush. Hearing: very poor Neck: Supple, No tracheal deviation. No obvious thyromegaly Chest: No pain to chest wall compression. Good respiratory excursion. No audible wheezing CV: Pulses intact. regular. No major extremity edema Ext: No obvious deformity or contracture. Edema: Not present. No cyanosis Skin: No major subcutaneous nodules. Warm and dry Musculoskeletal: Severe joint rigidity not present. No obvious clubbing. No digital petechiae. Mobility: no assist device moving easily without restrictions  Abdomen: Flat Soft. Nondistended. Nontender. Hernia: Not present. Diastasis recti: Not present. No hepatomegaly. No splenomegaly.  Genital/Pelvic: Inguinal  hernia: Not present. Inguinal lymph nodes: without lymphadenopathy nor hidradenitis.  Rectal: (Deferred)    ###################################################################  Labs, Imaging and Diagnostic Testing:  Located in 'Care Everywhere' section of Epic EMR chart  PRIOR [facility] CLINIC NOTES:  Not applicable  SURGERY NOTES:  Not applicable  PATHOLOGY:  Not applicable  Assessment and Plan: DIAGNOSES:  Diagnoses and all orders for this visit:  Incarcerated hiatal hernia  Unintentional weight loss  Chest pain, unspecified type  Dysphagia, unspecified type  Bilateral hearing loss, unspecified hearing loss type  AMD (age related macular degeneration)  Hepatic cyst    ASSESSMENT/PLAN  89 year old elderly but relatively healthy/active woman with a large hiatal hernia with her entire stomach and part of her transverse colon incarcerated into the mediastinum feeling up much of it.  Standard of care is to consider surgical reduction & repair. MIS Robotic approach.  Most likely have to do some diaphragmatic releases with mesh reinforcement.  Probable partial posterior Toupet fundoplication as tolerated but not be too aggressive and elderly woman. I would have to have lower abdominal ports since she is sure wasted with a large liver with cysts.  The anatomy & physiology of the foregut and anti-reflux mechanism was discussed. The pathophysiology of hiatal herniation and GERD was discussed. Natural history risks without surgery was discussed. The patient's symptoms are not adequately controlled by medicines and other non-operative treatments. I feel the risks of no intervention will lead to serious problems that outweigh the operative risks; therefore, I recommended surgery to reduce the hiatal hernia out of the chest and fundoplication to rebuild the anti-reflux valve and control reflux better. Need for a thorough workup to rule out the differential diagnosis and plan  treatment was explained. I explained minimally invasive techniques with possible need for an open approach.  Risks such as bleeding, infection, abscess, leak,injury to other organs, need for repair of tissues /  organs, need for further treatment, stroke, heart attack, death, and other risks were discussed. I noted a good likelihood this will help address the problem. Goals of post-operative recovery were discussed as well. Possibility that this will not correct all symptoms was explained. Post-operative dysphagia, need for short-term liquid & pureed diet, inability to vomit, possibility of reherniation, possible need for medicines to help control symptoms in addition to surgery were discussed. We will work to minimize complications. Educational handouts further explaining the pathology, treatment options, and dysphagia diet was given as well. Questions were answered.  Understandably, with her advanced age her operative risks are increased.  She has undergone medical and cardiac clearance.  Feeling by the patient is that she wishes to be aggressive and family agrees.  She had issues with respiratory symptoms so surgery was delayed.  She is feeling better and wishes to proceed.     Ardeth Sportsman, MD, FACS, MASCRS Esophageal, Gastrointestinal & Colorectal Surgery Robotic and Minimally Invasive Surgery  [facility] [address] [place], [place] [address] [phone] Fax [phone] Main  CONTACT INFORMATION:  Weekday (9AM-5PM): Call [facility] main office at [phone]  Weeknight (5PM-9AM) or Weekend/Holiday: Check [email or web address] (password " [email or web address]") for General Surgery [facility] coverage  (Please, do not use SecureChat as it is not reliable communication to reach operating surgeons for immediate patient care given surgeries/outpatient duties/clinic/cross-coverage/off post-call which would lead to a delay in care.  Epic staff messaging  available for outptient concerns, but may not be answered for 48 hours or more).    06/15/2022

## 2022-06-15 NOTE — Anesthesia Postprocedure Evaluation (Signed)
Anesthesia Post Note  Patient: Kristen Marshall  Procedure(s) Performed: XI ROBOTIC ASSISTED PARAESOPHAGEAL HIATAL HERNIA REPAIR WITH  MESH AND FUNDOPLICATION     Patient location during evaluation: PACU Anesthesia Type: General Level of consciousness: awake Pain management: pain level controlled Vital Signs Assessment: post-procedure vital signs reviewed and stable Respiratory status: spontaneous breathing, nonlabored ventilation and respiratory function stable Cardiovascular status: blood pressure returned to baseline and stable Postop Assessment: no apparent nausea or vomiting Anesthetic complications: no   No notable events documented.  Last Vitals:  Vitals:   06/15/22 1830 06/15/22 1900  BP: (!) 149/70 (!) 155/70  Pulse: 76 77  Resp: 15 14  Temp:    SpO2: 97% 98%    Last Pain:  Vitals:   06/15/22 1700  TempSrc: Axillary  PainSc:                  Catheryn Bacon Dawayne Ohair

## 2022-06-16 ENCOUNTER — Inpatient Hospital Stay (HOSPITAL_COMMUNITY): Payer: Medicare Other

## 2022-06-16 ENCOUNTER — Encounter (HOSPITAL_COMMUNITY): Payer: Self-pay | Admitting: Surgery

## 2022-06-16 DIAGNOSIS — N182 Chronic kidney disease, stage 2 (mild): Secondary | ICD-10-CM | POA: Insufficient documentation

## 2022-06-16 LAB — CBC
HCT: 36.9 % (ref 36.0–46.0)
Hemoglobin: 12 g/dL (ref 12.0–15.0)
MCH: 30.8 pg (ref 26.0–34.0)
MCHC: 32.5 g/dL (ref 30.0–36.0)
MCV: 94.9 fL (ref 80.0–100.0)
Platelets: 229 10*3/uL (ref 150–400)
RBC: 3.89 MIL/uL (ref 3.87–5.11)
RDW: 12.3 % (ref 11.5–15.5)
WBC: 10.9 10*3/uL — ABNORMAL HIGH (ref 4.0–10.5)
nRBC: 0 % (ref 0.0–0.2)

## 2022-06-16 LAB — BASIC METABOLIC PANEL
Anion gap: 10 (ref 5–15)
BUN: 25 mg/dL — ABNORMAL HIGH (ref 8–23)
CO2: 25 mmol/L (ref 22–32)
Calcium: 8 mg/dL — ABNORMAL LOW (ref 8.9–10.3)
Chloride: 99 mmol/L (ref 98–111)
Creatinine, Ser: 1.3 mg/dL — ABNORMAL HIGH (ref 0.44–1.00)
GFR, Estimated: 38 mL/min — ABNORMAL LOW (ref >60)
Glucose, Bld: 143 mg/dL — ABNORMAL HIGH (ref 70–99)
Potassium: 4.1 mmol/L (ref 3.5–5.1)
Sodium: 134 mmol/L — ABNORMAL LOW (ref 135–145)

## 2022-06-16 LAB — MAGNESIUM: Magnesium: 1.5 mg/dL — ABNORMAL LOW (ref 1.7–2.4)

## 2022-06-16 MED ORDER — LACTATED RINGERS IV BOLUS
1000.0000 mL | Freq: Once | INTRAVENOUS | Status: AC
  Administered 2022-06-16: 1000 mL via INTRAVENOUS

## 2022-06-16 MED ORDER — ENSURE SURGERY PO LIQD
237.0000 mL | Freq: Two times a day (BID) | ORAL | Status: DC
  Administered 2022-06-17 (×2): 237 mL via ORAL
  Filled 2022-06-16: qty 237

## 2022-06-16 MED ORDER — HYDROMORPHONE HCL 1 MG/ML IJ SOLN: 0.5000 mg | INTRAMUSCULAR | Status: DC | PRN

## 2022-06-16 MED ORDER — METOPROLOL TARTRATE 5 MG/5ML IV SOLN: 2.5000 mg | Freq: Four times a day (QID) | INTRAVENOUS | Status: DC | PRN

## 2022-06-16 MED ORDER — LACTATED RINGERS IV BOLUS
500.0000 mL | Freq: Once | INTRAVENOUS | Status: AC
  Administered 2022-06-16: 500 mL via INTRAVENOUS

## 2022-06-16 MED ORDER — ALBUMIN HUMAN 5 % IV SOLN
12.5000 g | Freq: Once | INTRAVENOUS | Status: AC
  Administered 2022-06-16: 12.5 g via INTRAVENOUS
  Filled 2022-06-16: qty 250

## 2022-06-16 MED ORDER — IOHEXOL 300 MG/ML  SOLN
100.0000 mL | Freq: Once | INTRAMUSCULAR | Status: AC | PRN
  Administered 2022-06-16: 100 mL via ORAL

## 2022-06-16 MED ORDER — METOCLOPRAMIDE HCL 5 MG/ML IJ SOLN: 5.0000 mg | Freq: Three times a day (TID) | INTRAMUSCULAR | Status: DC | PRN

## 2022-06-16 NOTE — Progress Notes (Addendum)
Kristen Marshall [id number] 06-Jul-1925  CARE TEAM:  PCP: Carylon Perches, MD  Outpatient Care Team: Patient Care Team: Carylon Perches, MD as PCP - General (Internal Medicine) Mallipeddi, Orion Modest, MD as PCP - Cardiology (Cardiology) Karie Soda, MD as Consulting Physician (General Surgery) Malissa Hippo, MD (Inactive) as Consulting Physician (Gastroenterology)  Inpatient Treatment Team: Treatment Team: Attending Provider: Karie Soda, MD; Speech Language Pathologist: Hassel Neth, CCC-SLP; Registered Nurse: Odette Horns, RN; Physical Therapist: Dahlia Bailiff, PT; Registered Nurse: Severiano Gilbert, RN   Problem List:   Principal Problem:   Incarcerated hiatal hernia s/p robotic repair/Toupet 06/15/2022 Active Problems:   HTN (hypertension)   Bilateral hearing loss   Hepatic cyst   Dysphagia   1 Day Post-Op  06/15/2022  POST-OPERATIVE DIAGNOSIS:  INCARCERATED PARAESOPHAGEAL HIATAL HERNIA WITH MESENTERO-AXIAL VOLVULUS   PROCEDURE:   1. ROBOTIC reduction of paraesophageal hiatal hernia 2. Type II mediastinal dissection. 3. Crural release - bilateral 4. Primary repair of hiatal hernia over pledgets.  5. Anterior & posterior gastropexy. 6. Toupet (270 degree partial posterior x 4cm)  fundoplication 7. Mesh reinforcement with absorbable mesh   SURGEON:  Ardeth Sportsman, MD  OR FINDINGS:    Giant paraesophageal hiatal hernia with all of the stomach and over half of the transverse colon and omentum incarcerated in the mediastinum.  There was a 15 x 12 cm hiatal defect.  Required bilateral crural/diaphragmatic releases.   It is a primary repair over pledgets.  Mesh reinforcement was used with PhasixT Mesh: a knitted monofilament mesh scaffold using Poly-4-hydroxybutyrate (P4HB), a biologically derived, fully resorbable material   The patient has a Toupet (270 degree partial posterior x 4cm)  fundoplication.  The patient has had anterior and posterior  gastropexy.  Assessment  Okay  [facility] Stay = 1 days)  Plan:  Low volume thin liquids as tolerated.  Esophagram this morning.  If no evidence of any obstruction or leak, advance to pured diet.  Most likely will need to stay on that for the first several weeks.  Hopefully then gradually advance to a solid diet in the next 6-8 weeks.  Have speech therapy evaluate given her very advanced age to make sure were not missing anything.  Could be helpful to train on pureed diet as well.  Transfer to floor to help challenge her and mobilize her.  Albumin for volume x1 now & PRN.  Try and keep on the dry side.  Follow-up on labs.  Hold off on blood pressure medicines.  Try to do low-dose metoprolol since she seemed hypertensive intraoperatively but is borderline normal/soft this morning.  Will switch metoprolol to as needed.  Continue angiotensin II inhibitor as needed.  VTE prophylaxis- SCDs, etc  Mobilize as tolerated to help recovery.  Get Physical and Occupational Therapy to see for evaluation.  My instinct is she would benefit from short-term skilled nursing facility.  The patient does not want to do that and wants to go home and be independent by herself right away, but I cautioned that was not too realistic.  Family very nervous about her going straight home since they live out of town as well.  We will see.  Disposition:  Disposition:  The patient is from: Home  Anticipate discharge to:  Skilled Nursing Facility (SNF)  Anticipated Date of Discharge is:  June 2,2024    Barriers to discharge:  Consultant clearance & sign off  , Therapy assessment & Recommendations pending, Need for inpatient procedure/study,  and Pending Clinical improvement (more likely than not)  Patient currently is NOT MEDICALLY STABLE for discharge from the hospital from a surgery standpoint.      I reviewed nursing notes, last 24 h vitals and pain scores, last 48 h intake and output, last 24 h labs and  trends, and last 24 h imaging results. I have reviewed this patient's available data, including medical history, events of note, test results, etc as part of my evaluation.  A significant portion of that time was spent in counseling.  Care during the described time interval was provided by me.  This care required moderate level of medical decision making.  06/16/2022    Subjective: (Chief complaint)  Son and daughter at bedside.  ICU nurse just outside room.  Patient a little tired but denies much nausea or pain.  Tolerated a few sips but did not like to swallow whole pill.  Blood pressure somewhat soft.  No volume.  Did not want to get up to urinate.  I&O cath x 2 done.  Objective:  Vital signs:  Vitals:   06/16/22 0611 06/16/22 0612 06/16/22 0613 06/16/22 0630  BP:    (!) 97/56  Pulse: 70 71 71 86  Resp: 13 13 14 10   Temp:      TempSrc:      SpO2: 96% 96% 95% 96%  Weight:      Height:        Last BM Date :  (PTA)  Intake/Output   Yesterday:  05/29 0701 - 05/30 0700 In: 1939.7 [I.V.:1839.7; IV Piggyback:100] Out: 880 [Urine:650; Drains:230] This shift:  No intake/output data recorded.  Bowel function:  Flatus: YES  BM:  No  Drain: Serosanguinous   Physical Exam:  General: Pt resting but eventually awakens to be mostly alert and in no acute distress Eyes: PERRL, normal EOM.  Sclera clear.  No icterus Neuro: CN II-XII intact w/o focal sensory/motor deficits. Lymph: No head/neck/groin lymphadenopathy Psych:  No delerium/psychosis/paranoia.  Oriented x 4 HENT: Normocephalic, Mucus membranes moist.  No thrush.  Remains hard of hearing specially that her hearing aids. Neck: Supple, No tracheal deviation.  No obvious thyromegaly Chest: No pain to chest wall compression.  Good respiratory excursion.  No audible wheezing CV:  Pulses intact.  Regular rhythm.  No major extremity edema MS: Normal AROM mjr joints.  No obvious deformity  Abdomen: Soft.   Nondistended.  Nontender.  No evidence of peritonitis.  No incarcerated hernias.  Ext:  No deformity.  No mjr edema.  No cyanosis Skin: No petechiae / purpurea.  No major sores.  Warm and dry.  No signif SQ crepitus    Results:   Cultures: Recent Results (from the past 720 hour(s))  MRSA Next Gen by PCR, Nasal     Status: None   Collection Time: 06/15/22  4:48 PM   Specimen: Nasal Mucosa; Nasal Swab  Result Value Ref Range Status   MRSA by PCR Next Gen NOT DETECTED NOT DETECTED Final    Comment: (NOTE) The GeneXpert MRSA Assay (FDA approved for NASAL specimens only), is one component of a comprehensive MRSA colonization surveillance program. It is not intended to diagnose MRSA infection nor to guide or monitor treatment for MRSA infections. Test performance is not FDA approved in patients less than 8 years old. Performed at [facility], [address], [place], [place] [address]     Labs: Results for orders placed or performed during the hospital encounter of 06/15/22 (from  the past 48 hour(s))  MRSA Next Gen by PCR, Nasal     Status: None   Collection Time: 06/15/22  4:48 PM   Specimen: Nasal Mucosa; Nasal Swab  Result Value Ref Range   MRSA by PCR Next Gen NOT DETECTED NOT DETECTED    Comment: (NOTE) The GeneXpert MRSA Assay (FDA approved for NASAL specimens only), is one component of a comprehensive MRSA colonization surveillance program. It is not intended to diagnose MRSA infection nor to guide or monitor treatment for MRSA infections. Test performance is not FDA approved in patients less than 20 years old. Performed at [facility], [address] [place], [place] [address]     Imaging / Studies: No results found.  Medications / Allergies: per chart  Antibiotics: Anti-infectives (From admission, onward)    Start     Dose/Rate Route Frequency Ordered Stop   06/15/22 1015  cefTRIAXone (ROCEPHIN) 2 g in sodium chloride  0.9 % 100 mL IVPB        2 g 200 mL/hr over 30 Minutes Intravenous On call to O.R. 06/15/22 1010 06/15/22 1302         Note: Portions of this report may have been transcribed using voice recognition software. Every effort was made to ensure accuracy; however, inadvertent computerized transcription errors may be present.   Any transcriptional errors that result from this process are unintentional.    Ardeth Sportsman, MD, FACS, MASCRS Esophageal, Gastrointestinal & Colorectal Surgery Robotic and Minimally Invasive Surgery  [facility] [address] [place], [place] [address] [phone] Fax [phone] Main  CONTACT INFORMATION:  Weekday (9AM-5PM): Call [facility] main office at [phone]  Weeknight (5PM-9AM) or Weekend/Holiday: Check [email or web address] (password " [email or web address]") for General Surgery [facility] coverage  (Please, do not use SecureChat as it is not reliable communication to reach operating surgeons for immediate patient care given surgeries/outpatient duties/clinic/cross-coverage/off post-call which would lead to a delay in care.  Epic staff messaging available for outptient concerns, but may not be answered for 48 hours or more).     06/16/2022  7:11 AM

## 2022-06-16 NOTE — Evaluation (Signed)
Clinical/Bedside Swallow Evaluation Patient Details  Name: JALEIGH PROVAN MRN: [id number] Date of Birth: 05-May-1925  Today's Date: 06/16/2022 Time: SLP Start Time (ACUTE ONLY): 1512 SLP Stop Time (ACUTE ONLY): 1530 SLP Time Calculation (min) (ACUTE ONLY): 18 min  Past Medical History:  Past Medical History:  Diagnosis Date   Arthritis    Glaucoma    HOH (hard of hearing)    HTN (hypertension)    Past Surgical History:  Past Surgical History:  Procedure Laterality Date   APPENDECTOMY     COLONOSCOPY     COLONOSCOPY  03/17/2011   Procedure: COLONOSCOPY;  Surgeon: Malissa Hippo, MD;  Location: [facility];  Service: Endoscopy;  Laterality: N/A;  130   HIP SURGERY  x 2 hip replacements   JOINT REPLACEMENT  x 2 hips   TONSILLECTOMY     UPPER GASTROINTESTINAL ENDOSCOPY     WRIST FRACTURE SURGERY     left   XI ROBOTIC ASSISTED PARAESOPHAGEAL HERNIA REPAIR N/A 06/15/2022   Procedure: XI ROBOTIC ASSISTED PARAESOPHAGEAL HIATAL HERNIA REPAIR WITH  MESH AND FUNDOPLICATION;  Surgeon: Karie Soda, MD;  Location: [facility];  Service: General;  Laterality: N/A;   HPI:  89 yo female adm to [facility] - ROBOTIC reduction of paraesophageal hiatal hernia,  full code 89 yo-- Patient arrived to stepdown, subcutaneous air noted in upper chest and neck Large hiatal hernia reduced below the hemidiaphragms. Fundoplication wrap is patent. Mild stasis of contrast above the wrap. Small triangular collection of contrast RIGHT lateral to the wrap is favored a surgical redundant pleat and not a leak.  Swallow evaluation ordered.  Pt denies h/o dysphagia - rather reports having pain in her abdomen.    Assessment / Plan / Recommendation  Clinical Impression  Patient presents with functional oropharyngeal swallow.   No clinical indications of aspiration and swallow was timely and no indication of retention.  She takes very small boluses - likely compensatory for her known h/o esophageal deficits. Pt admits she does not  get enjoyment from eating. Recommend continue diet as surgery advises.  No SLP follow up. SLP Visit Diagnosis: Dysphagia, unspecified (R13.10)    Aspiration Risk  Mild aspiration risk    Diet Recommendation  (defer to surgical team)   Liquid Administration via: Cup;Straw Medication Administration: Whole meds with puree (as tolerated, use applesauce if problematic) Supervision: Patient able to self feed Compensations: Slow rate;Small sips/bites Postural Changes: Seated upright at 90 degrees;Remain upright for at least 30 minutes after po intake    Other  Recommendations Oral Care Recommendations: Oral care BID    Recommendations for follow up therapy are one component of a multi-disciplinary discharge planning process, led by the attending physician.  Recommendations may be updated based on patient status, additional functional criteria and insurance authorization.  Follow up Recommendations No SLP follow up      Assistance Recommended at Discharge    Functional Status Assessment Patient has had a recent decline in their functional status and demonstrates the ability to make significant improvements in function in a reasonable and predictable amount of time.  Frequency and Duration      N/a      Prognosis   N/a     Swallow Study   General Date of Onset: 06/16/22 HPI: 89 yo female adm to [facility] - ROBOTIC reduction of paraesophageal hiatal hernia,  full code 89 yo-- Patient arrived to stepdown, subcutaneous air noted in upper chest and neck Large hiatal hernia reduced below the hemidiaphragms.  Fundoplication wrap is patent. Mild stasis of contrast above the wrap. Small triangular collection of contrast RIGHT lateral to the wrap is favored a surgical redundant pleat and not a leak.  Swallow evaluation ordered.  Pt denies h/o dysphagia - rather reports having pain in her abdomen. Type of Study: Bedside Swallow Evaluation Diet Prior to this Study: Full liquid diet Temperature Spikes  Noted: No Respiratory Status: Room air History of Recent Intubation: No Behavior/Cognition: Alert;Cooperative;Pleasant mood;Other (Comment) (HOH) Oral Cavity Assessment: Within Functional Limits Oral Care Completed by SLP: No Oral Cavity - Dentition: Adequate natural dentition Vision: Functional for self-feeding Self-Feeding Abilities: Able to feed self Patient Positioning: Upright in bed Baseline Vocal Quality: Normal Volitional Cough: Strong Volitional Swallow: Able to elicit    Oral/Motor/Sensory Function Overall Oral Motor/Sensory Function: Within functional limits   Ice Chips Ice chips: Not tested   Thin Liquid Thin Liquid: Within functional limits Presentation: Cup;Straw    Nectar Thick Nectar Thick Liquid: Not tested   Honey Thick Honey Thick Liquid: Not tested   Puree Puree: Within functional limits Presentation: Self Fed;Spoon   Solid     Solid: Within functional limits      Chales Abrahams 06/16/2022,3:57 PM Rolena Infante, MS [facility] SLP Acute Rehab Services Office [phone]

## 2022-06-16 NOTE — Progress Notes (Signed)
Pt awake in PACU - some confusion but consolable VSS  I called & on the 4th call reached the pt's daughter & son.  I discussed operative findings, updated the patient's status, discussed probable steps to recovery, and gave postoperative recommendations to the patient's daughter, Olegario Messier, & son .  Son Recommendations were made.  Questions were answered.  They expressed understanding & appreciation.

## 2022-06-16 NOTE — Evaluation (Signed)
Occupational Therapy Evaluation Patient Details Name: Kristen Marshall MRN: [id number] DOB: [birth year] Today's Date: 06/16/2022   History of Present Illness Pt s/p Incarcerated hiatal hernia s/p robotic repair/Toupet 06/15/2022 by Dr. Michaell Cowing.  PMHx: glaucoma, HOH, arthritis, HTN   Clinical Impression   Pt lives alone and is assisted for housekeeping and meal preparation. She has significant hearing loss and macular degeneration. Pt's daughter notes she has recently started having difficulty managing medication. Pt is independent in self care in her home environment. Pt presents with decreased awareness of deficits, generalized weakness and impaired standing balance requiring moderate assistance with second person for safety to ambulate with RW. Pt needs set up to total assist for ADLs. Family is working towards hiring caregivers, but at this point pt will not have assist at home as her children live out of town. Patient will benefit from continued inpatient follow up therapy, <3 hours/day.     Recommendations for follow up therapy are one component of a multi-disciplinary discharge planning process, led by the attending physician.  Recommendations may be updated based on patient status, additional functional criteria and insurance authorization.   Assistance Recommended at Discharge Frequent or constant Supervision/Assistance  Patient can return home with the following Two people to help with walking and/or transfers;Assistance with cooking/housework;Direct supervision/assist for medications management;Direct supervision/assist for financial management;Assist for transportation;Help with stairs or ramp for entrance;Two people to help with bathing/dressing/bathroom    Functional Status Assessment  Patient has had a recent decline in their functional status and demonstrates the ability to make significant improvements in function in a reasonable and predictable amount of time.  Equipment  Recommendations  BSC/3in1    Recommendations for Other Services       Precautions / Restrictions Precautions Precautions: Fall Precaution Comments: legally blind, HOH, JP drain Restrictions Weight Bearing Restrictions: No      Mobility Bed Mobility Overal bed mobility: Needs Assistance Bed Mobility: Rolling, Sidelying to Sit Rolling: Min assist Sidelying to sit: Min assist       General bed mobility comments: log roll technique, assist for upper body    Transfers Overall transfer level: Needs assistance Equipment used: Rolling walker (2 wheels) Transfers: Sit to/from Stand Sit to Stand: Min assist, +2 physical assistance           General transfer comment: tactile and verbal cues for hand placement, posterior bias, assist to rise and steady      Balance Overall balance assessment: Needs assistance   Sitting balance-Leahy Scale: Good     Standing balance support: Bilateral upper extremity supported, During functional activity, Reliant on assistive device for balance Standing balance-Leahy Scale: Poor Standing balance comment: reliant on RW and mod assist                           ADL either performed or assessed with clinical judgement   ADL Overall ADL's : Needs assistance/impaired Eating/Feeding: Set up;Sitting   Grooming: Set up;Sitting   Upper Body Bathing: Minimal assistance;Sitting   Lower Body Bathing: Maximal assistance;Sit to/from stand;+2 for physical assistance   Upper Body Dressing : Minimal assistance;Sitting Upper Body Dressing Details (indicate cue type and reason): to orient front opening gown Lower Body Dressing: Maximal assistance;Sit to/from stand;+2 for physical assistance   Toilet Transfer: Moderate assistance;Ambulation;Rolling walker (2 wheels)           Functional mobility during ADLs: Moderate assistance;+2 for safety/equipment;Rolling walker (2 wheels)       Vision  Baseline Vision/History: 6 Macular  Degeneration Ability to See in Adequate Light: 3 Highly impaired Patient Visual Report: Central vision impairment       Perception     Praxis      Pertinent Vitals/Pain Pain Assessment Pain Assessment: Faces Faces Pain Scale: Hurts a little bit Pain Location: abdomen Pain Descriptors / Indicators: Sore Pain Intervention(s): Monitored during session, Repositioned     Hand Dominance Right   Extremity/Trunk Assessment Upper Extremity Assessment Upper Extremity Assessment: Overall WFL for tasks assessed   Lower Extremity Assessment Lower Extremity Assessment: Defer to PT evaluation       Communication Communication Communication: HOH   Cognition Arousal/Alertness: Awake/alert Behavior During Therapy: WFL for tasks assessed/performed Overall Cognitive Status: Impaired/Different from baseline Area of Impairment: Orientation, Safety/judgement, Memory                 Orientation Level: Disoriented to, Place       Safety/Judgement: Decreased awareness of deficits           General Comments       Exercises     Shoulder Instructions      Home Living Family/patient expects to be discharged to:: Private residence Living Arrangements: Alone   Type of Home: House       Home Layout: One level               Home Equipment: Agricultural consultant (2 wheels);Cane - single point          Prior Functioning/Environment Prior Level of Function : Needs assist             Mobility Comments: pt knows how many steps to frequented locations in home, can ambulate without assistive device ADLs Comments: pt does not cook, friends/family frequently bring her food, no longer driving, has cleaning service, per daughter, pt is unable to manage her medications        OT Problem List: Decreased activity tolerance;Impaired balance (sitting and/or standing);Decreased cognition;Decreased safety awareness;Decreased knowledge of use of DME or AE      OT  Treatment/Interventions: Self-care/ADL training;DME and/or AE instruction;Therapeutic activities;Patient/family education;Balance training;Cognitive remediation/compensation    OT Goals(Current goals can be found in the care plan section) Acute Rehab OT Goals OT Goal Formulation: With patient/family Time For Goal Achievement: 06/30/22 Potential to Achieve Goals: Good ADL Goals Pt Will Perform Grooming: with min assist;standing Pt Will Perform Lower Body Bathing: with min assist;sit to/from stand Pt Will Perform Lower Body Dressing: with min assist;sit to/from stand Pt Will Transfer to Toilet: with min assist;ambulating;bedside commode Pt Will Perform Toileting - Clothing Manipulation and hygiene: with min assist;sit to/from stand Additional ADL Goal #1: Pt will perform bed mobility modified independently in preparation for ADLs.  OT Frequency: Min 2X/week    Co-evaluation PT/OT/SLP Co-Evaluation/Treatment: Yes Reason for Co-Treatment: For patient/therapist safety PT goals addressed during session: Mobility/safety with mobility OT goals addressed during session: ADL's and self-care      AM-PAC OT "6 Clicks" Daily Activity     Outcome Measure Help from another person eating meals?: A Little Help from another person taking care of personal grooming?: A Little Help from another person toileting, which includes using toliet, bedpan, or urinal?: A Lot Help from another person bathing (including washing, rinsing, drying)?: A Lot Help from another person to put on and taking off regular upper body clothing?: A Little Help from another person to put on and taking off regular lower body clothing?: Total 6 Click Score: 14   End  of Session Equipment Utilized During Treatment: Rolling walker (2 wheels);Gait belt Nurse Communication: Mobility status;Other (comment) (IV bleeding)  Activity Tolerance: Patient tolerated treatment well Patient left: in chair;with call bell/phone within reach;with  chair alarm set;with family/visitor present  OT Visit Diagnosis: Unsteadiness on feet (R26.81);Other abnormalities of gait and mobility (R26.89);Pain;Muscle weakness (generalized) (M62.81);Other symptoms and signs involving cognitive function                Time: 1425-1451 OT Time Calculation (min): 26 min Charges:  OT General Charges $OT Visit: 1 Visit OT Evaluation $OT Eval Moderate Complexity: 1 Mod  Kristen Marshall, OTR/L Acute Rehabilitation Services Office: [phone]   Kristen Marshall 06/16/2022, 4:20 PM

## 2022-06-16 NOTE — Progress Notes (Signed)
Sent page to [facility] surgery. Pt has had no urine out. I&O 50 ml @ 1330. Notified in page.

## 2022-06-16 NOTE — TOC Initial Note (Signed)
Transition of Care [facility]) - Initial/Assessment Note    Patient Details  Name: Kristen Marshall MRN: [id number] Date of Birth: February 22, 1925  Transition of Care [facility]) CM/SW Contact:    Adrian Prows, RN Phone Number: 06/16/2022, 10:30 AM  Clinical Narrative:                 [facility] consult for d/c planning; spoke w pt, [name], POC) [phone], and son [name]; pt is from home; they hope she can return at d/c; pt does not experience IPV, food insecurity, and difficulty paying for utilities; she does not have housing insecurity; they say pt is [facility] and legally blind; she does not wear glasses, and she has bilateral hearing aids; pt has cane, walker, shower chair, and grab bars; she does not have HH services or home oxygen; pt's family says she has private pay aid that comes out for 2 hrs twice week; awaiting PT eval; TOC will follow.  Expected Discharge Plan: Home/Self Care Barriers to Discharge: Continued Medical Work up   Patient Goals and CMS Choice Patient states their goals for this hospitalization and ongoing recovery are:: home          Expected Discharge Plan and Services   Discharge Planning Services: CM Consult   Living arrangements for the past 2 months: Single Family Home                                      Prior Living Arrangements/Services Living arrangements for the past 2 months: Single Family Home Lives with:: Self Patient language and need for interpreter reviewed:: Yes Do you feel safe going back to the place where you live?: Yes      Need for Family Participation in Patient Care: Yes (Comment) Care giver support system in place?: Yes (comment) Current home services: DME (cane, walker, shower chair) Criminal Activity/Legal Involvement Pertinent to Current Situation/Hospitalization: No - Comment as needed  Activities of Daily Living      Permission Sought/Granted Permission sought to share information with : Case Manager Permission  granted to share information with : Yes, Verbal Permission Granted  Share Information with NAME: Burnard Bunting, RN, CM     Permission granted to share info w Relationship: Caro Laroche (dtr) [phone]     Emotional Assessment Appearance:: Appears stated age Attitude/Demeanor/Rapport: Gracious Affect (typically observed): Accepting Orientation: : Oriented to Self, Oriented to Place, Oriented to  Time, Oriented to Situation Alcohol / Substance Use: Not Applicable Psych Involvement: No (comment)  Admission diagnosis:  Paraesophageal hiatal hernia [K44.9] Incarcerated hiatal hernia [K44.0] Patient Active Problem List   Diagnosis Date Noted   Preop cardiovascular exam 03/02/2022   Swelling of lower extremity 03/02/2022   Incarcerated hiatal hernia s/p robotic repair/T[place]et 06/15/2022 01/25/2022   AMD (age related macular degeneration) 01/25/2022   Bilateral hearing loss 01/25/2022   Hepatic cyst 01/25/2022   Dysphagia 01/25/2022   Unintentional weight loss 01/25/2022   Chest pain 01/25/2022   HTN (hypertension) 02/22/2011   Arthritis of knee, right 02/22/2011   Hematochezia 02/22/2011   Family history of carcinoma in situ of colon 02/22/2011   PCP:  Carylon Perches, MD Pharmacy:   [facility] - [place], [place] - [phone] [place] [address] Phone: [phone] Fax: [phone]     Social Determinants of Health (SDOH) Social History: SDOH Screenings   Food Insecurity: No Food Insecurity (06/16/2022)  Housing: Low Risk  (06/16/2022)  Transportation Needs: No Transportation Needs (06/16/2022)  Utilities: Not At Risk (06/16/2022)  Tobacco Use: Low Risk  (06/16/2022)   SDOH Interventions: Food Insecurity Interventions: Inpatient TOC Housing Interventions: Inpatient TOC Transportation Interventions: Inpatient TOC Utilities Interventions: Inpatient TOC   Readmission Risk Interventions     No data to display

## 2022-06-16 NOTE — Evaluation (Signed)
Physical Therapy Evaluation Patient Details Name: Kristen Marshall MRN: [id number] DOB: 1925-11-17 Today's Date: 06/16/2022  History of Present Illness  Pt s/p Incarcerated hiatal hernia s/p robotic repair/Toupet 06/15/2022 by Dr. Michaell Cowing.  PMHx: glaucoma, HOH, arthritis, HTN  Clinical Impression  Pt admitted with above diagnosis.  Pt currently with functional limitations due to the deficits listed below (see PT Problem List). Pt will benefit from acute skilled PT to increase their independence and safety with mobility to allow discharge.  Pt assisted with mobilizing and requiring at least mod assist for transfers and ambulation at this time.  Pt also very HOH and has visual impairments so recliner followed pt for safety.  Family is working towards hiring caregivers, but at this point pt will not have assist at home as her children live out of town. Patient will benefit from continued inpatient follow up therapy, <3 hours/day.         Recommendations for follow up therapy are one component of a multi-disciplinary discharge planning process, led by the attending physician.  Recommendations may be updated based on patient status, additional functional criteria and insurance authorization.  Follow Up Recommendations       Assistance Recommended at Discharge Intermittent Supervision/Assistance  Patient can return home with the following  A little help with walking and/or transfers;A little help with bathing/dressing/bathroom;Help with stairs or ramp for entrance;Assist for transportation;Assistance with cooking/housework;Direct supervision/assist for financial management;Direct supervision/assist for medications management    Equipment Recommendations None recommended by PT  Recommendations for Other Services       Functional Status Assessment Patient has had a recent decline in their functional status and demonstrates the ability to make significant improvements in function in a reasonable and  predictable amount of time.     Precautions / Restrictions Precautions Precautions: Fall Precaution Comments: legally blind, HOH, JP drain Restrictions Weight Bearing Restrictions: No      Mobility  Bed Mobility Overal bed mobility: Needs Assistance Bed Mobility: Rolling, Sidelying to Sit Rolling: Min assist Sidelying to sit: Min assist       General bed mobility comments: log roll technique, assist for upper body    Transfers Overall transfer level: Needs assistance Equipment used: Rolling walker (2 wheels) Transfers: Sit to/from Stand Sit to Stand: Mod assist           General transfer comment: tactile and verbal cues for hand placement, posterior bias, assist to rise and steady    Ambulation/Gait Ambulation/Gait assistance: Mod assist Gait Distance (Feet): 40 Feet Assistive device: Rolling walker (2 wheels) Gait Pattern/deviations: Step-through pattern, Decreased stride length, Trunk flexed       General Gait Details: multimodal cues for direction and assist for stabilizing and negotiating RW, recliner following for safety  Stairs            Wheelchair Mobility    Modified Rankin (Stroke Patients Only)       Balance Overall balance assessment: Needs assistance         Standing balance support: Bilateral upper extremity supported, During functional activity, Reliant on assistive device for balance Standing balance-Leahy Scale: Poor                               Pertinent Vitals/Pain Pain Assessment Pain Assessment: Faces Faces Pain Scale: Hurts a little bit Pain Location: abdomen Pain Descriptors / Indicators: Sore Pain Intervention(s): Monitored during session, Repositioned    Home Living Family/patient expects to be  discharged to:: Private residence Living Arrangements: Alone   Type of Home: House         Home Layout: One level Home Equipment: Agricultural consultant (2 wheels);Cane - single point      Prior Function  Prior Level of Function : Needs assist             Mobility Comments: pt knows how many steps to frequented locations in home, can ambulate without assistive device ADLs Comments: pt does not cook, friends/family frequently bring her food, no longer driving, has cleaning service, per daughter, pt is unable to manage her medications     Hand Dominance   Dominant Hand: Right    Extremity/Trunk Assessment   Upper Extremity Assessment Upper Extremity Assessment: Overall WFL for tasks assessed    Lower Extremity Assessment Lower Extremity Assessment: Defer to PT evaluation       Communication   Communication: HOH  Cognition Arousal/Alertness: Awake/alert Behavior During Therapy: WFL for tasks assessed/performed Overall Cognitive Status: Within Functional Limits for tasks assessed                                          General Comments      Exercises     Assessment/Plan    PT Assessment Patient needs continued PT services  PT Problem List Decreased strength;Decreased mobility;Decreased activity tolerance;Decreased knowledge of use of DME;Decreased balance       PT Treatment Interventions DME instruction;Gait training;Balance training;Therapeutic exercise;Functional mobility training;Therapeutic activities;Patient/family education    PT Goals (Current goals can be found in the Care Plan section)  Acute Rehab PT Goals PT Goal Formulation: With patient/family Time For Goal Achievement: 06/30/22 Potential to Achieve Goals: Good    Frequency Min 1X/week     Co-evaluation PT/OT/SLP Co-Evaluation/Treatment: Yes Reason for Co-Treatment: For patient/therapist safety PT goals addressed during session: Mobility/safety with mobility OT goals addressed during session: ADL's and self-care       AM-PAC PT "6 Clicks" Mobility  Outcome Measure Help needed turning from your back to your side while in a flat bed without using bedrails?: A Lot Help  needed moving from lying on your back to sitting on the side of a flat bed without using bedrails?: A Lot Help needed moving to and from a bed to a chair (including a wheelchair)?: A Lot Help needed standing up from a chair using your arms (e.g., wheelchair or bedside chair)?: A Lot Help needed to walk in hospital room?: A Lot Help needed climbing 3-5 steps with a railing? : Total 6 Click Score: 11    End of Session Equipment Utilized During Treatment: Gait belt Activity Tolerance: Patient tolerated treatment well Patient left: in chair;with family/visitor present;with call bell/phone within reach;with chair alarm set Nurse Communication: Mobility status PT Visit Diagnosis: Difficulty in walking, not elsewhere classified (R26.2)    Time: 1425-1450 PT Time Calculation (min) (ACUTE ONLY): 25 min   Charges:   PT Evaluation $PT Eval Low Complexity: 1 Low        Kati PT, DPT Physical Therapist Acute Rehabilitation Services Office: [phone]   Janan Halter [place] 06/16/2022, 4:32 PM

## 2022-06-16 NOTE — Evaluation (Signed)
SLP Cancellation Note  Patient Details Name: ALEXIA BYER MRN: [id number] DOB: January 15, 1926   Cancelled treatment:       Reason Eval/Treat Not Completed: Other (comment);Patient at procedure or test/unavailable   Chales Abrahams 06/16/2022, 10:52 AM   Rolena Infante, MS [facility] SLP Acute Rehab Services Office [phone]

## 2022-06-17 LAB — HEMOGLOBIN: Hemoglobin: 9.1 g/dL — ABNORMAL LOW (ref 12.0–15.0)

## 2022-06-17 LAB — POTASSIUM: Potassium: 3.7 mmol/L (ref 3.5–5.1)

## 2022-06-17 LAB — CREATININE, SERUM
Creatinine, Ser: 1.38 mg/dL — ABNORMAL HIGH (ref 0.44–1.00)
GFR, Estimated: 35 mL/min — ABNORMAL LOW (ref >60)

## 2022-06-17 MED ORDER — HEPARIN SODIUM (PORCINE) 5000 UNIT/ML IJ SOLN
5000.0000 [IU] | Freq: Three times a day (TID) | INTRAMUSCULAR | Status: DC
  Administered 2022-06-17 – 2022-06-18 (×4): 5000 [IU] via SUBCUTANEOUS
  Filled 2022-06-17 (×4): qty 1

## 2022-06-17 MED ORDER — LACTATED RINGERS IV BOLUS
500.0000 mL | Freq: Once | INTRAVENOUS | Status: AC
  Administered 2022-06-17: 500 mL via INTRAVENOUS

## 2022-06-17 NOTE — Progress Notes (Signed)
Patient has had 180 mL of urine output since approximately 1330 today (06/16/22). Bladder scan showing 0 mL currently, no complaints of pain. Had at least two I&O caths performed during day shift. Inserted Foley catheter per orders with urine returned. [facility] Surgery to notify Dr. Michaell Cowing, MD is aware. Orders to transfer patient to [facility] [place], report called to Betsey Amen, RN. Awaiting transport at this time.

## 2022-06-17 NOTE — NC FL2 (Signed)
[place] MEDICAID FL2 LEVEL OF CARE FORM     IDENTIFICATION  Patient Name: Kristen Marshall Birthdate: [birth year] Sex: female Admission Date (Current Location): 06/17/2022  [facility] and [place] Number:  Producer, television/film/video and Address:  [facility],  [address] [place], [place] [address]      Provider Number: [id number]  Attending Physician Name and Address:  Karie Soda, MD  Relative Name and Phone Number:  Olegario Messier [phone]    Current Level of Care: Hospital Recommended Level of Care: Skilled Nursing Facility Prior Approval Number:    Date Approved/Denied:   PASRR Number: [id number]  Discharge Plan: SNF    Current Diagnoses: Patient Active Problem List   Diagnosis Date Noted   CKD (chronic kidney disease) stage 2, GFR 60-89 ml/min 06/16/2022   Preop cardiovascular exam 03/02/2022   Swelling of lower extremity 03/02/2022   Incarcerated hiatal hernia s/p robotic repair/T[place]et 05/26/2022 01/25/2022   AMD (age related macular degeneration) 01/25/2022   Bilateral hearing loss 01/25/2022   Hepatic cyst 01/25/2022   Dysphagia 01/25/2022   Unintentional weight loss 01/25/2022   Chest pain 01/25/2022   HTN (hypertension) 02/22/2011   Arthritis of knee, right 02/22/2011   Hematochezia 02/22/2011   Family history of carcinoma in situ of colon 02/22/2011    Orientation RESPIRATION BLADDER Height & Weight     Self, Time, Situation, Place  Normal Continent Weight: 55.3 kg Height:  5\' 1"  (154.9 cm)  BEHAVIORAL SYMPTOMS/MOOD NEUROLOGICAL BOWEL NUTRITION STATUS      Continent Diet (Dysphagia 1 diet)  AMBULATORY STATUS COMMUNICATION OF NEEDS Skin   Limited Assist Verbally Normal                       Personal Care Assistance Level of Assistance  Bathing, Feeding, Dressing Bathing Assistance: Limited assistance Feeding assistance: Limited assistance Dressing Assistance: Limited assistance     Functional Limitations Info  Sight, Hearing,  Speech Sight Info: Impaired (blind) Hearing Info: Impaired (Bilateral hearing aids) Speech Info: Impaired    SPECIAL CARE FACTORS FREQUENCY  PT (By licensed PT), OT (By licensed OT)     PT Frequency: 5x week OT Frequency: 5x week            Contractures Contractures Info: Not present    Additional Factors Info  Code Status, Allergies Code Status Info: Full Allergies Info: Codeine           Current Medications (06/17/2022):  This is the current hospital active medication list Current Facility-Administered Medications  Medication Dose Route Frequency Provider Last Rate Last Admin   0.9 %  sodium chloride infusion  250 mL Intravenous PRN Gross, Viviann Spare, MD       acetaminophen (TYLENOL) tablet 1,000 mg  1,000 mg Oral Trecia Rogers, MD   1,000 mg at 06/17/22 1202   albumin human 5 % solution 12.5 g  12.5 g Intravenous Q6H PRN Karie Soda, MD 60 mL/hr at 06/16/22 1828 12.5 g at 06/16/22 1828   alum & mag hydroxide-simeth (MAALOX/MYLANTA) 200-200-20 MG/5ML suspension 30 mL  30 mL Oral Q6H PRN Karie Soda, MD       bisacodyl (DULCOLAX) suppository 10 mg  10 mg Rectal Daily PRN Karie Soda, MD       Chlorhexidine Gluconate Cloth 2 % PADS 6 each  6 each Topical Daily Karie Soda, MD   6 each at 06/17/22 0948   diphenhydrAMINE (BENADRYL) 12.5 MG/5ML elixir 12.5 mg  12.5 mg Oral Q6H  PRN Karie Soda, MD       Or   diphenhydrAMINE (BENADRYL) injection 12.5 mg  12.5 mg Intravenous Q6H PRN Karie Soda, MD       enalaprilat (VASOTEC) injection 0.625-1.25 mg  0.625-1.25 mg Intravenous Q6H PRN Karie Soda, MD       feeding supplement (ENSURE SURGERY) liquid 237 mL  237 mL Oral BID BM Karie Soda, MD   237 mL at 06/17/22 1335   gabapentin (NEURONTIN) capsule 300 mg  300 mg Oral BID Karie Soda, MD   300 mg at 06/17/22 1610   haloperidol lactate (HALDOL) injection 2 mg  2 mg Intravenous Q6H PRN Karie Soda, MD       heparin injection 5,000 Units  5,000 Units Subcutaneous  Trixie Deis, MD   5,000 Units at 06/17/22 1334   HYDROmorphone (DILAUDID) injection 0.5-1 mg  0.5-1 mg Intravenous Q4H PRN Karie Soda, MD       latanoprost (XALATAN) 0.005 % ophthalmic solution 1 drop  1 drop Both Eyes Laurena Slimmer, MD   1 drop at 06/16/22 2145   lip balm (CARMEX) ointment   Topical BID Karie Soda, MD   Given at 06/17/22 9604   magic mouthwash  15 mL Oral QID PRN Karie Soda, MD       magnesium hydroxide (MILK OF MAGNESIA) suspension 30 mL  30 mL Oral Daily PRN Karie Soda, MD   30 mL at 06/17/22 1201   menthol-cetylpyridinium (CEPACOL) lozenge 3 mg  1 lozenge Oral PRN Karie Soda, MD       methocarbamol (ROBAXIN) 1,000 mg in dextrose 5 % 100 mL IVPB  1,000 mg Intravenous Q6H PRN Karie Soda, MD       methocarbamol (ROBAXIN) tablet 500 mg  500 mg Oral Q6H PRN Karie Soda, MD       metoCLOPramide (REGLAN) injection 5 mg  5 mg Intravenous Q8H PRN Karie Soda, MD       metoprolol tartrate (LOPRESSOR) injection 2.5 mg  2.5 mg Intravenous Q6H PRN Karie Soda, MD       ondansetron (ZOFRAN-ODT) disintegrating tablet 4 mg  4 mg Oral Q6H PRN Karie Soda, MD       Or   ondansetron [facility]) injection 4 mg  4 mg Intravenous Q6H PRN Karie Soda, MD   4 mg at 06/16/22 5409   Oral care mouth rinse  15 mL Mouth Rinse 4 times per day Karie Soda, MD   15 mL at 06/17/22 1531   Oral care mouth rinse  15 mL Mouth Rinse PRN Karie Soda, MD       oxyCODONE (Oxy IR/ROXICODONE) immediate release tablet 2.5-5 mg  2.5-5 mg Oral Q4H PRN Karie Soda, MD   2.5 mg at 05/31/2022 2010   phenol (CHLORASEPTIC) mouth spray 2 spray  2 spray Mouth/Throat PRN Karie Soda, MD       prochlorperazine (COMPAZINE) tablet 10 mg  10 mg Oral Q6H PRN Karie Soda, MD       Or   prochlorperazine (COMPAZINE) injection 5-10 mg  5-10 mg Intravenous Q6H PRN Karie Soda, MD       simethicone (MYLICON) 40 MG/0.6ML suspension 80 mg  80 mg Oral QID PRN Karie Soda, MD       sodium  chloride flush (NS) 0.9 % injection 3 mL  3 mL Intravenous Catha Gosselin, MD   3 mL at 06/17/22 0948   sodium chloride flush (NS) 0.9 % injection 3 mL  3 mL Intravenous PRN  Karie Soda, MD         Discharge Medications: Please see discharge summary for a list of discharge medications.  Relevant Imaging Results:  Relevant Lab Results:   Additional Information ss#[id number] [phone]  Shauntell Iglesia, Olegario Messier, RN

## 2022-06-17 NOTE — TOC Progression Note (Signed)
Transition of Care [facility]) - Progression Note    Patient Details  Name: Kristen Marshall MRN: [id number] Date of Birth: Jul 24, 1925  [facility] CM/SW Contact  Ayce Pietrzyk, Olegario Messier, RN Phone Number: 06/17/2022, 3:53 PM  Clinical Narrative: Spoke to patient's family in rm about recc ST SNF-Son Mo in rm agreed to [facility]. Faxed out-Await bed offers.      Expected Discharge Plan: Skilled Nursing Facility Barriers to Discharge: Continued Medical Work up  Expected Discharge Plan and Services   Discharge Planning Services: CM Consult   Living arrangements for the past 2 months: Single Family Home                                       Social Determinants of Health (SDOH) Interventions SDOH Screenings   Food Insecurity: No Food Insecurity (06/16/2022)  Housing: Low Risk  (06/16/2022)  Transportation Needs: No Transportation Needs (06/16/2022)  Utilities: Not At Risk (06/16/2022)  Tobacco Use: Low Risk  (06/16/2022)    Readmission Risk Interventions     No data to display

## 2022-06-17 NOTE — Progress Notes (Signed)
Kristen Marshall [id number] 05-05-1925  CARE TEAM:  PCP: Carylon Perches, MD  Outpatient Care Team: Patient Care Team: Carylon Perches, MD as PCP - General (Internal Medicine) Mallipeddi, Orion Modest, MD as PCP - Cardiology (Cardiology) Karie Soda, MD as Consulting Physician (General Surgery) Malissa Hippo, MD (Inactive) as Consulting Physician (Gastroenterology)  Inpatient Treatment Team: Treatment Team: Attending Provider: Karie Soda, MD; Technician: Awilda Metro, NT   Problem List:   Principal Problem:   Incarcerated hiatal hernia s/p robotic repair/Toupet 06/09/2022 Active Problems:   HTN (hypertension)   Bilateral hearing loss   Hepatic cyst   Dysphagia   CKD (chronic kidney disease) stage 2, GFR 60-89 ml/min   2 Days Post-Op  06/09/2022  POST-OPERATIVE DIAGNOSIS:  INCARCERATED PARAESOPHAGEAL HIATAL HERNIA WITH MESENTERO-AXIAL VOLVULUS   PROCEDURE:   1. ROBOTIC reduction of paraesophageal hiatal hernia 2. Type II mediastinal dissection. 3. Crural release - bilateral 4. Primary repair of hiatal hernia over pledgets.  5. Anterior & posterior gastropexy. 6. Toupet (270 degree partial posterior x 4cm)  fundoplication 7. Mesh reinforcement with absorbable mesh   SURGEON:  Ardeth Sportsman, MD  OR FINDINGS:    Giant paraesophageal hiatal hernia with all of the stomach and over half of the transverse colon and omentum incarcerated in the mediastinum.  There was a 15 x 12 cm hiatal defect.  Required bilateral crural/diaphragmatic releases.   It is a primary repair over pledgets.  Mesh reinforcement was used with PhasixT Mesh: a knitted monofilament mesh scaffold using Poly-4-hydroxybutyrate (P4HB), a biologically derived, fully resorbable material   The patient has a Toupet (270 degree partial posterior x 4cm)  fundoplication.  The patient has had anterior and posterior gastropexy.  Assessment  Okay  [facility] Stay = 2 days)  Plan:  Esophagram without any leak or  obstruction.  Cleared by speech therapy to swallow.  Do pured diet.  Try to instruct.  Patient confesses she does not have much of an appetite or urgency.  I noted it was not safe to go home until I knew that she could eat and drink enough to avoid getting dehydrated.  May have to do calorie counts.  Most aggressive option is feeding gastrostomy tube in the short-term but I would like to avoid that the patient really does not want that.  Chronic kidney disease with mild elevation most likely consistent with dehydration.  Trying to do as needed boluses to avoid continuous IV fluids and fluid overload.  She is not oliguric.  Her creatinine is stable from preop.  We will see.    Keep drain for now given the giant mediastinal hernia sac.  Most likely will go home with that and follow-up in the office in about a week.  Thank you patient really wants to just go straight home and claims she has some get help for few hours.  Son and daughter very anxious about this and wanted to go to skilled facility.  I am leaning more towards the latter option to be safe.  Awaiting input from therapies to see if they agree.  Asked to see if process can be done for skilled facility set up in the short-term.    History hypertension.  Holding home medications for now.  Breakthrough.  Follow-up.    VTE prophylaxis- SCDs, etc  Mobilize as tolerated to help recovery.  Get Physical and Occupational Therapy to see for evaluation.  My instinct is she would benefit from short-term skilled nursing facility.  The patient  does not want to do that and wants to go home and be independent by herself right away, but I cautioned that was not too realistic.  Family very nervous about her going straight home since they live out of town as well.  We will see.  Disposition:  Disposition:  The patient is from: Home  Anticipate discharge to:  Skilled Nursing Facility (SNF)  Anticipated Date of Discharge is:  June 2,2024    Barriers to  discharge:  Consultant clearance & sign off  , Therapy assessment & Recommendations pending, Need for inpatient procedure/study, and Pending Clinical improvement (more likely than not)  Patient currently is NOT MEDICALLY STABLE for discharge from the hospital from a surgery standpoint.      I reviewed nursing notes, last 24 h vitals and pain scores, last 48 h intake and output, last 24 h labs and trends, and last 24 h imaging results. I have reviewed this patient's available data, including medical history, events of note, test results, etc as part of my evaluation.  A significant portion of that time was spent in counseling.  Care during the described time interval was provided by me.  This care required moderate level of medical decision making.  06/17/2022    Subjective: (Chief complaint)  Patient not urinating much.  Foley placed.  IV fluid boluses given.  Patient denies much pain.  Does not have her hearing aids and remains hard of hearing.  Does not have much of an appetite but denies any nausea or vomiting.  Objective:  Vital signs:  Vitals:   06/16/22 1930 06/17/22 0031 06/17/22 0144 06/17/22 0552  BP: (!) 104/55 115/60 (!) 111/56 (!) 94/55  Pulse: 87 (!) 103 (!) 106 90  Resp: 18  17 19   Temp: 98.1 F (36.7 C)  98 F (36.7 C) 99.6 F (37.6 C)  TempSrc: Oral     SpO2: 99% 96% 98% 97%  Weight:      Height:        Last BM Date :  (PTA)  Intake/Output   Yesterday:  05/30 0701 - 05/31 0700 In: 1383 [P.O.:130; I.V.:3; IV Piggyback:1250] Out: 440 [Urine:380; Drains:60] This shift:  Total I/O In: 883 [P.O.:130; I.V.:3; IV Piggyback:750] Out: 280 [Urine:270; Drains:10]  Bowel function:  Flatus: YES  BM:  No  Drain: Serosanguinous   Physical Exam:  General: Pt resting but eventually awakens to be alert and in no acute distress Eyes: PERRL, normal EOM.  Sclera clear.  No icterus Neuro: CN II-XII intact w/o focal sensory/motor deficits.  Initially  mumbling and slurring but after a few sips of water speaking normally. Lymph: No head/neck/groin lymphadenopathy Psych:  No delerium/psychosis/paranoia.  Oriented x 4 HENT: Normocephalic, Mucus membranes moist.  No thrush.  Remains hard of hearing Neck: Supple, No tracheal deviation.  No obvious thyromegaly Chest: No pain to chest wall compression.  Good respiratory excursion.  No audible wheezing CV:  Pulses intact.  Regular rhythm.  No major extremity edema MS: Normal AROM mjr joints.  No obvious deformity  Abdomen: Soft.  Nondistended.  Nontender.  No evidence of peritonitis.  No incarcerated hernias.  Ext:  No deformity.  No mjr edema.  No cyanosis Skin: No petechiae / purpurea.  No major sores.  Warm and dry.  No signif SQ crepitus    Results:   Cultures: Recent Results (from the past 720 hour(s))  MRSA Next Gen by PCR, Nasal     Status: None   Collection Time: 06/12/2022  4:48 PM   Specimen: Nasal Mucosa; Nasal Swab  Result Value Ref Range Status   MRSA by PCR Next Gen NOT DETECTED NOT DETECTED Final    Comment: (NOTE) The GeneXpert MRSA Assay (FDA approved for NASAL specimens only), is one component of a comprehensive MRSA colonization surveillance program. It is not intended to diagnose MRSA infection nor to guide or monitor treatment for MRSA infections. Test performance is not FDA approved in patients less than 43 years old. Performed at [facility], [address], [place], [place] [address]     Labs: Results for orders placed or performed during the hospital encounter of 06/01/2022 (from the past 48 hour(s))  MRSA Next Gen by PCR, Nasal     Status: None   Collection Time: 05/22/2022  4:48 PM   Specimen: Nasal Mucosa; Nasal Swab  Result Value Ref Range   MRSA by PCR Next Gen NOT DETECTED NOT DETECTED    Comment: (NOTE) The GeneXpert MRSA Assay (FDA approved for NASAL specimens only), is one component of a comprehensive MRSA colonization  surveillance program. It is not intended to diagnose MRSA infection nor to guide or monitor treatment for MRSA infections. Test performance is not FDA approved in patients less than 3 years old. Performed at [facility], [address], [place], [place] [address]   Basic metabolic panel     Status: Abnormal   Collection Time: 06/16/22  8:16 AM  Result Value Ref Range   Sodium 134 (L) 135 - 145 mmol/L   Potassium 4.1 3.5 - 5.1 mmol/L   Chloride 99 98 - 111 mmol/L   CO2 25 22 - 32 mmol/L   Glucose, Bld 143 (H) 70 - 99 mg/dL    Comment: Glucose reference range applies only to samples taken after fasting for at least 8 hours.   BUN 25 (H) 8 - 23 mg/dL   Creatinine, Ser 0.98 (H) 0.44 - 1.00 mg/dL   Calcium 8.0 (L) 8.9 - 10.3 mg/dL   GFR, Estimated 38 (L) >60 mL/min    Comment: (NOTE) Calculated using the CKD-EPI Creatinine Equation (2021)    Anion gap 10 5 - 15    Comment: Performed at [facility], [address] [place], [place] [address]  Magnesium     Status: Abnormal   Collection Time: 06/16/22  8:16 AM  Result Value Ref Range   Magnesium 1.5 (L) 1.7 - 2.4 mg/dL    Comment: Performed at [facility], [address] [place], [place] [address]  CBC     Status: Abnormal   Collection Time: 06/16/22  8:16 AM  Result Value Ref Range   WBC 10.9 (H) 4.0 - 10.5 K/uL   RBC 3.89 3.87 - 5.11 MIL/uL   Hemoglobin 12.0 12.0 - 15.0 g/dL   HCT 62.1 30.8 - 65.7 %   MCV 94.9 80.0 - 100.0 fL   MCH 30.8 26.0 - 34.0 pg   MCHC 32.5 30.0 - 36.0 g/dL   RDW 84.6 96.2 - 95.2 %   Platelets 229 150 - 400 K/uL   nRBC 0.0 0.0 - 0.2 %    Comment: Performed at [facility], [address], [place], [place] [address]  Hemoglobin     Status: Abnormal   Collection Time: 06/17/22  1:32 AM  Result Value Ref Range   Hemoglobin 9.1 (L) 12.0 - 15.0 g/dL    Comment: Performed at [facility], [address] [place], [place] [address]  Creatinine, serum     Status: Abnormal   Collection Time: 06/17/22  1:32 AM  Result Value Ref Range   Creatinine, Ser 1.38 (H) 0.44 - 1.00 mg/dL   GFR, Estimated 35 (L) >60 mL/min    Comment: (NOTE) Calculated using the CKD-EPI Creatinine Equation (2021) Performed at [facility], [address] [place], [place] [address]   Potassium     Status: None   Collection Time: 06/17/22  1:32 AM  Result Value Ref Range   Potassium 3.7 3.5 - 5.1 mmol/L    Comment: Performed at [facility], [address], [place], [place] [address]    Imaging / Studies: DG ESOPHAGUS W SINGLE CM (SOL OR THIN BA)  Result Date: 06/16/2022 CLINICAL DATA:  Post laparoscopic fundoplication for hiatal hernia. EXAM: ESOPHAGUS/BARIUM SWALLOW/TABLET STUDY TECHNIQUE: Single contrast examination was performed using thin liquid barium. This exam was performed by Dairl Ponder, and was supervised and interpreted by Dr. Amil Amen. FLUOROSCOPY: Radiation Exposure Index (as provided by the fluoroscopic device): 6.8 mGy Kerma COMPARISON:  Plain film 05/03/2022 FINDINGS: Swallowing: Appears normal. No vestibular penetration or aspiration seen. Pharynx: Unremarkable. Esophagus: The large hiatal hernia has been reduced below the hemidiaphragms. For indication wrap noted. Contrast flows through the wrap without evidence of obstruction. Small triangular pocket of contrast collects RIGHT lateral to the wrap which is favored a redundant surgical pleat. No evidence of leak. Esophageal motility: Some retention of contrast above the wrap. Hiatal Hernia: Reduced below the hemidiaphragms. Gastroesophageal reflux: None visualized. Other: None. IMPRESSION: 1. Large hiatal hernia reduced below the hemidiaphragms. 2. Fundoplication wrap is patent. Mild stasis of contrast above the wrap. 3. Small triangular collection of contrast RIGHT lateral to the wrap is favored a surgical redundant pleat and  not a leak. Electronically Signed   By: Genevive Bi M.D.   On: 06/16/2022 12:43    Medications / Allergies: per chart  Antibiotics: Anti-infectives (From admission, onward)    Start     Dose/Rate Route Frequency Ordered Stop   06/09/2022 1015  cefTRIAXone (ROCEPHIN) 2 g in sodium chloride 0.9 % 100 mL IVPB        2 g 200 mL/hr over 30 Minutes Intravenous On call to O.R. 06/10/2022 1010 05/22/2022 1302         Note: Portions of this report may have been transcribed using voice recognition software. Every effort was made to ensure accuracy; however, inadvertent computerized transcription errors may be present.   Any transcriptional errors that result from this process are unintentional.    Ardeth Sportsman, MD, FACS, MASCRS Esophageal, Gastrointestinal & Colorectal Surgery Robotic and Minimally Invasive Surgery  [facility] [address] [place], [place] [address] [phone] Fax [phone] Main  CONTACT INFORMATION:  Weekday (9AM-5PM): Call [facility] main office at [phone]  Weeknight (5PM-9AM) or Weekend/Holiday: Check [email or web address] (password " [email or web address]") for General Surgery [facility] coverage  (Please, do not use SecureChat as it is not reliable communication to reach operating surgeons for immediate patient care given surgeries/outpatient duties/clinic/cross-coverage/off post-call which would lead to a delay in care.  Epic staff messaging available for outptient concerns, but may not be answered for 48 hours or more).     06/17/2022  6:38 AM

## 2022-06-17 NOTE — Progress Notes (Signed)
Initial Nutrition Assessment  INTERVENTION:   -48 hour Calorie Count per Surgery  -Ensure Surgery PO BID, each provides 330 kcals and 18g protein   -Magic cup TID with meals, each supplement provides 290 kcal and 9 grams of protein   NUTRITION DIAGNOSIS:   Increased nutrient needs related to post-op healing as evidenced by estimated needs.  GOAL:   Patient will meet greater than or equal to 90% of their needs   MONITOR:   PO intake, Supplement acceptance, Labs, Weight trends, I & O's  REASON FOR ASSESSMENT:   Consult Calorie Count  ASSESSMENT:   89 y.o. patient with history of paraesophageal hernia.  5/30: s/p paraesophageal hiatal hernia repair with mesh and fundoplication  Patient in room, very HOH but was able to hear me if I spoke directly at her ear. Family at bedside as well to help with history. Pt states she does not have an appetite and this has been the case for a while. She doesn't really eat food other than Ensure/Boost supplements. She likes ice cream and enjoyed Magic cups yesterday. She is on a dysphagia 1 diet following hernia surgery. Discussed diet with family. Encouraged use of protein shakes while following diet.  She states she has drank an Ensure already today.  Had 1/2 bowl of cream of wheat (but prefers oatmeal) and 1/2 cup of yogurt this morning for breakfast. Lunch was on it's way per family. Calorie Count envelope on door.  Per weight records, no significant weight changes noted.   Medications reviewed.  Labs reviewed.  NUTRITION - FOCUSED PHYSICAL EXAM:  Flowsheet Row Most Recent Value  Orbital Region Moderate depletion  Upper Arm Region Moderate depletion  Thoracic and Lumbar Region Unable to assess  Buccal Region Moderate depletion  Temple Region Moderate depletion  Clavicle Bone Region Mild depletion  Clavicle and Acromion Bone Region Mild depletion  Scapular Bone Region Unable to assess  Dorsal Hand Severe depletion  Patellar  Region Unable to assess  Anterior Thigh Region Unable to assess  Posterior Calf Region Unable to assess  Edema (RD Assessment) Severe  [BLEs]  Hair Reviewed  Eyes Reviewed  Mouth Reviewed  Skin Reviewed       Diet Order:   Diet Order             DIET - DYS 1 Room service appropriate? Yes; Fluid consistency: Thin  Diet effective now                   EDUCATION NEEDS:   Education needs have been addressed  Skin:  Skin Assessment: Reviewed RN Assessment  Last BM:  PTA  Height:   Ht Readings from Last 1 Encounters:  06/05/2022 5\' 1"  (1.549 m)    Weight:   Wt Readings from Last 1 Encounters:  06/02/2022 55.3 kg    BMI:  Body mass index is 23.04 kg/m.  Estimated Nutritional Needs:   Kcal:  1450-1650  Protein:  65-75g  Fluid:  1.6L/day   Tilda Franco, MS, RD, LDN Inpatient Clinical Dietitian Contact information available via Amion

## 2022-06-18 LAB — CREATININE, SERUM
Creatinine, Ser: 1.11 mg/dL — ABNORMAL HIGH (ref 0.44–1.00)
GFR, Estimated: 46 mL/min — ABNORMAL LOW (ref >60)

## 2022-06-18 LAB — HEMOGLOBIN: Hemoglobin: 9.2 g/dL — ABNORMAL LOW (ref 12.0–15.0)

## 2022-06-18 LAB — POTASSIUM: Potassium: 3.7 mmol/L (ref 3.5–5.1)

## 2022-06-18 MED FILL — Medication: Qty: 1 | Status: AC

## 2022-06-18 DEATH — deceased

## 2022-07-18 NOTE — Progress Notes (Signed)
Unit secretory call primary nurse to check the patient. Patient's son was bed side. Patient was found unresponsive,no pulse and respiration then immediately started the chest compression and call  for code blue. MD and charge nurse was on the side.patient's son was on the bed side entire the CPR process.

## 2022-07-18 NOTE — Code Documentation (Signed)
Code Blue heard overhead, rapid response at bedside two minutes later. [facility] provider had already began compressions. This RN began to bag patient with BVM per ACLS. This RN was met with a lot of resistance when bagging patient. After a full round of compressions, and epi no pulse was felt and patient remained in asystole. [facility] provider pronounced patient deceased.

## 2022-07-18 NOTE — Progress Notes (Signed)
Pt prepared and all tubes removed. Bathed and appropriate tags placed. Pt's personal cane was sent with funeral director to give family. Deceased pt was transported via stretcher off unit accompanied by Marine scientist and the [facility], [employer].

## 2022-07-18 NOTE — Death Summary Note (Signed)
DEATH SUMMARY   Patient Details  Name: Kristen Marshall MRN: [id number] DOB: 05-15-1925  Admission/Discharge Information   Admit Date:  Jul 09, 2022  Date of Death:  12-Jul-2022  Time of Death:  809 am   Length of Stay: 3  Referring Physician: Carylon Perches, MD   Reason(s) for Hospitalization  Incarcerated hiatal hernia  Diagnoses  Preliminary cause of death:  Secondary Diagnoses (including complications and co-morbidities):  Principal Problem:   Incarcerated hiatal hernia s/p robotic repair/Toupet 07-09-22 Active Problems:   HTN (hypertension)   Bilateral hearing loss   Hepatic cyst   Dysphagia   CKD (chronic kidney disease) stage 2, GFR 60-89 ml/min   Brief Hospital Course (including significant findings, care, treatment, and services provided and events leading to death)  Kristen Marshall is a 89 y.o. year old female who admitted to Dr. Michaell Cowing for repair of a large incarcerated paraesophageal hernia.  Patient has history of multiple obstructions of her stomach and gastric volvulus situations.  She was advanced age and she was counseled on the pros and cons of surgery.  The patient was progressing well and on postoperative day 3 was found unresponsive in her bed by nursing staff around 8:00.  Her son was in the room and said she ate breakfast earlier.  She then became unresponsive but unclear on timeline.  Staff was called to the room and chest compressions were started.  She was hooked to a monitor and airway was secured.  She had no EKG activity whatsoever.  Epi was given and chest compressions continued but there is no electrical activity.  Her pupils were fixed and dilated.  She had no pulse.  Given the above findings and advanced age, CPR was ceased time of death 8:09 AM.    Pertinent Labs and Studies  Significant Diagnostic Studies DG ESOPHAGUS W SINGLE CM (SOL OR THIN BA)  Result Date: 06/16/2022 CLINICAL DATA:  Post laparoscopic fundoplication for hiatal hernia. EXAM:  ESOPHAGUS/BARIUM SWALLOW/TABLET STUDY TECHNIQUE: Single contrast examination was performed using thin liquid barium. This exam was performed by Dairl Ponder, and was supervised and interpreted by Dr. Amil Amen. FLUOROSCOPY: Radiation Exposure Index (as provided by the fluoroscopic device): 6.8 mGy Kerma COMPARISON:  Plain film 05/03/2022 FINDINGS: Swallowing: Appears normal. No vestibular penetration or aspiration seen. Pharynx: Unremarkable. Esophagus: The large hiatal hernia has been reduced below the hemidiaphragms. For indication wrap noted. Contrast flows through the wrap without evidence of obstruction. Small triangular pocket of contrast collects RIGHT lateral to the wrap which is favored a redundant surgical pleat. No evidence of leak. Esophageal motility: Some retention of contrast above the wrap. Hiatal Hernia: Reduced below the hemidiaphragms. Gastroesophageal reflux: None visualized. Other: None. IMPRESSION: 1. Large hiatal hernia reduced below the hemidiaphragms. 2. Fundoplication wrap is patent. Mild stasis of contrast above the wrap. 3. Small triangular collection of contrast RIGHT lateral to the wrap is favored a surgical redundant pleat and not a leak. Electronically Signed   By: Genevive Bi M.D.   On: 06/16/2022 12:43    Microbiology Recent Results (from the past 240 hour(s))  MRSA Next Gen by PCR, Nasal     Status: None   Collection Time: 07/09/2022  4:48 PM   Specimen: Nasal Mucosa; Nasal Swab  Result Value Ref Range Status   MRSA by PCR Next Gen NOT DETECTED NOT DETECTED Final    Comment: (NOTE) The GeneXpert MRSA Assay (FDA approved for NASAL specimens only), is one component of a comprehensive MRSA colonization surveillance program. It is  not intended to diagnose MRSA infection nor to guide or monitor treatment for MRSA infections. Test performance is not FDA approved in patients less than 5 years old. Performed at [facility], [address], [place],  [place] [address]     Lab Basic Metabolic Panel: Recent Labs  Lab 06/16/22 0816 06/17/22 0132 07/11/2022 0532  NA 134*  --   --   K 4.1 3.7 3.7  CL 99  --   --   CO2 25  --   --   GLUCOSE 143*  --   --   BUN 25*  --   --   CREATININE 1.30* 1.38* 1.11*  CALCIUM 8.0*  --   --   MG 1.5*  --   --    Liver Function Tests: No results for input(s): "AST", "ALT", "ALKPHOS", "BILITOT", "PROT", "ALBUMIN" in the last 168 hours. No results for input(s): "LIPASE", "AMYLASE" in the last 168 hours. No results for input(s): "AMMONIA" in the last 168 hours. CBC: Recent Labs  Lab 06/16/22 0816 06/17/22 0132 06/20/2022 0532  WBC 10.9*  --   --   HGB 12.0 9.1* 9.2*  HCT 36.9  --   --   MCV 94.9  --   --   PLT 229  --   --    Cardiac Enzymes: No results for input(s): "CKTOTAL", "CKMB", "CKMBINDEX", "TROPONINI" in the last 168 hours. Sepsis Labs: Recent Labs  Lab 06/16/22 0816  WBC 10.9*    Procedures/Operations  Laparoscopic repair of paraesophageal hernia with toupee fundoplication   Clovis Pu Burdette Gergely MD  06/28/2022, 8:48 AM

## 2022-07-18 NOTE — Progress Notes (Signed)
   06/22/2022 0905  Spiritual Encounters  Type of Visit Initial  Care provided to: Family  Referral source Other (comment)  Reason for visit Patient death  OnCall Visit Yes   Chaplain responded to a call for support of the family. Family was distraught over the sudden loss of their beloved family member. She had been in steady recovery and looking forward to rehab. Today that  changed. Family is expressing their grief in different ways but are allowing the grief to be expressed. Two of her children are present there are two more and many grandchildren. Family that is present shared stories as they said good bye to their mother. The patient, Kristen Marshall lived a full life and touched many people throughout her years.She will be remembered with much love. Before I departed we prayed for the comfort of the family. I encouraged the family present to treat themselves with gentleness and allow space to grieve.   They have given the details of the funeral home to the nurse, is is also listed below.  [facility]  [address] [place], [place] [address] [phone]  Valerie Roys Chaplain  [facility]  [phone]

## 2022-07-18 DEATH — deceased

## 2022-07-20 ENCOUNTER — Encounter (HOSPITAL_COMMUNITY): Admission: RE | Payer: Self-pay | Source: Home / Self Care

## 2022-07-20 ENCOUNTER — Ambulatory Visit (HOSPITAL_COMMUNITY): Admission: RE | Admit: 2022-07-20 | Payer: Medicare Other | Source: Home / Self Care | Admitting: Gastroenterology

## 2022-07-20 SURGERY — MANOMETRY, ESOPHAGUS
Anesthesia: Choice
